# Patient Record
Sex: Female | Born: 1980 | Race: White | Hispanic: No | Marital: Single | Smoking: Current every day smoker
Health system: Southern US, Community
[De-identification: ages and names within clinical notes are randomized; demographics above are authoritative.]

## PROBLEM LIST (undated history)

## (undated) DIAGNOSIS — J45909 Unspecified asthma, uncomplicated: Secondary | ICD-10-CM

## (undated) DIAGNOSIS — J353 Hypertrophy of tonsils with hypertrophy of adenoids: Secondary | ICD-10-CM

## (undated) DIAGNOSIS — J449 Chronic obstructive pulmonary disease, unspecified: Secondary | ICD-10-CM

## (undated) DIAGNOSIS — K219 Gastro-esophageal reflux disease without esophagitis: Secondary | ICD-10-CM

## (undated) DIAGNOSIS — L409 Psoriasis, unspecified: Secondary | ICD-10-CM

## (undated) DIAGNOSIS — R203 Hyperesthesia: Secondary | ICD-10-CM

## (undated) HISTORY — PX: DILATION AND CURETTAGE OF UTERUS: SHX78

## (undated) HISTORY — PX: ABDOMINAL HYSTERECTOMY: SHX81

## (undated) HISTORY — PX: ELBOW SURGERY: SHX618

---
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
## 2003-08-09 ENCOUNTER — Encounter: Payer: Self-pay | Admitting: Emergency Medicine

## 2003-08-09 ENCOUNTER — Inpatient Hospital Stay (HOSPITAL_COMMUNITY): Admission: EM | Admit: 2003-08-09 | Discharge: 2003-08-18 | Payer: Self-pay

## 2003-08-10 ENCOUNTER — Encounter: Payer: Self-pay | Admitting: Neurosurgery

## 2004-07-30 ENCOUNTER — Emergency Department (HOSPITAL_COMMUNITY): Admission: EM | Admit: 2004-07-30 | Discharge: 2004-07-30 | Payer: Self-pay | Admitting: Emergency Medicine

## 2004-11-09 ENCOUNTER — Emergency Department (HOSPITAL_COMMUNITY): Admission: EM | Admit: 2004-11-09 | Discharge: 2004-11-09 | Payer: Self-pay | Admitting: Emergency Medicine

## 2004-12-29 ENCOUNTER — Emergency Department (HOSPITAL_COMMUNITY): Admission: EM | Admit: 2004-12-29 | Discharge: 2004-12-29 | Payer: Self-pay | Admitting: Emergency Medicine

## 2005-01-02 ENCOUNTER — Emergency Department (HOSPITAL_COMMUNITY): Admission: EM | Admit: 2005-01-02 | Discharge: 2005-01-02 | Payer: Self-pay | Admitting: Emergency Medicine

## 2005-02-10 ENCOUNTER — Emergency Department (HOSPITAL_COMMUNITY): Admission: EM | Admit: 2005-02-10 | Discharge: 2005-02-11 | Payer: Self-pay | Admitting: Emergency Medicine

## 2005-03-01 ENCOUNTER — Emergency Department (HOSPITAL_COMMUNITY): Admission: EM | Admit: 2005-03-01 | Discharge: 2005-03-01 | Payer: Self-pay | Admitting: Emergency Medicine

## 2005-07-12 ENCOUNTER — Emergency Department (HOSPITAL_COMMUNITY): Admission: EM | Admit: 2005-07-12 | Discharge: 2005-07-12 | Payer: Self-pay | Admitting: Emergency Medicine

## 2005-09-27 ENCOUNTER — Emergency Department (HOSPITAL_COMMUNITY): Admission: EM | Admit: 2005-09-27 | Discharge: 2005-09-27 | Payer: Self-pay | Admitting: Emergency Medicine

## 2005-12-25 ENCOUNTER — Emergency Department (HOSPITAL_COMMUNITY): Admission: EM | Admit: 2005-12-25 | Discharge: 2005-12-25 | Payer: Self-pay | Admitting: Emergency Medicine

## 2006-05-26 ENCOUNTER — Emergency Department (HOSPITAL_COMMUNITY): Admission: EM | Admit: 2006-05-26 | Discharge: 2006-05-26 | Payer: Self-pay | Admitting: Emergency Medicine

## 2006-06-18 ENCOUNTER — Emergency Department (HOSPITAL_COMMUNITY): Admission: EM | Admit: 2006-06-18 | Discharge: 2006-06-18 | Payer: Self-pay | Admitting: Emergency Medicine

## 2006-06-24 ENCOUNTER — Emergency Department (HOSPITAL_COMMUNITY): Admission: EM | Admit: 2006-06-24 | Discharge: 2006-06-24 | Payer: Self-pay | Admitting: Emergency Medicine

## 2006-07-07 ENCOUNTER — Ambulatory Visit (HOSPITAL_COMMUNITY): Admission: RE | Admit: 2006-07-07 | Discharge: 2006-07-07 | Payer: Self-pay | Admitting: Obstetrics and Gynecology

## 2006-07-11 ENCOUNTER — Ambulatory Visit (HOSPITAL_COMMUNITY): Admission: AD | Admit: 2006-07-11 | Discharge: 2006-07-11 | Payer: Self-pay | Admitting: Obstetrics and Gynecology

## 2006-10-14 ENCOUNTER — Emergency Department (HOSPITAL_COMMUNITY): Admission: EM | Admit: 2006-10-14 | Discharge: 2006-10-14 | Payer: Self-pay | Admitting: Emergency Medicine

## 2006-11-06 ENCOUNTER — Inpatient Hospital Stay (HOSPITAL_COMMUNITY): Admission: AD | Admit: 2006-11-06 | Discharge: 2006-11-08 | Payer: Self-pay | Admitting: Obstetrics and Gynecology

## 2006-11-06 ENCOUNTER — Ambulatory Visit (HOSPITAL_COMMUNITY): Admission: RE | Admit: 2006-11-06 | Discharge: 2006-11-06 | Payer: Self-pay | Admitting: Obstetrics and Gynecology

## 2007-02-05 ENCOUNTER — Emergency Department (HOSPITAL_COMMUNITY): Admission: EM | Admit: 2007-02-05 | Discharge: 2007-02-05 | Payer: Self-pay | Admitting: Emergency Medicine

## 2007-02-22 ENCOUNTER — Emergency Department (HOSPITAL_COMMUNITY): Admission: EM | Admit: 2007-02-22 | Discharge: 2007-02-22 | Payer: Self-pay | Admitting: Emergency Medicine

## 2007-04-20 ENCOUNTER — Emergency Department (HOSPITAL_COMMUNITY): Admission: EM | Admit: 2007-04-20 | Discharge: 2007-04-20 | Payer: Self-pay | Admitting: Emergency Medicine

## 2007-05-22 ENCOUNTER — Emergency Department (HOSPITAL_COMMUNITY): Admission: EM | Admit: 2007-05-22 | Discharge: 2007-05-22 | Payer: Self-pay | Admitting: Emergency Medicine

## 2007-06-07 ENCOUNTER — Emergency Department (HOSPITAL_COMMUNITY): Admission: EM | Admit: 2007-06-07 | Discharge: 2007-06-07 | Payer: Self-pay | Admitting: Emergency Medicine

## 2007-06-13 ENCOUNTER — Emergency Department (HOSPITAL_COMMUNITY): Admission: EM | Admit: 2007-06-13 | Discharge: 2007-06-13 | Payer: Self-pay | Admitting: Emergency Medicine

## 2007-06-27 LAB — CONVERTED CEMR LAB: Pap Smear: NORMAL

## 2007-07-05 ENCOUNTER — Emergency Department (HOSPITAL_COMMUNITY): Admission: EM | Admit: 2007-07-05 | Discharge: 2007-07-05 | Payer: Self-pay | Admitting: Emergency Medicine

## 2007-07-26 ENCOUNTER — Emergency Department (HOSPITAL_COMMUNITY): Admission: EM | Admit: 2007-07-26 | Discharge: 2007-07-26 | Payer: Self-pay | Admitting: Emergency Medicine

## 2007-08-24 ENCOUNTER — Emergency Department (HOSPITAL_COMMUNITY): Admission: EM | Admit: 2007-08-24 | Discharge: 2007-08-24 | Payer: Self-pay | Admitting: Emergency Medicine

## 2007-09-22 ENCOUNTER — Emergency Department (HOSPITAL_COMMUNITY): Admission: EM | Admit: 2007-09-22 | Discharge: 2007-09-22 | Payer: Self-pay | Admitting: Emergency Medicine

## 2007-10-02 ENCOUNTER — Emergency Department (HOSPITAL_COMMUNITY): Admission: EM | Admit: 2007-10-02 | Discharge: 2007-10-02 | Payer: Self-pay | Admitting: Emergency Medicine

## 2007-10-27 ENCOUNTER — Emergency Department (HOSPITAL_COMMUNITY): Admission: EM | Admit: 2007-10-27 | Discharge: 2007-10-27 | Payer: Self-pay | Admitting: Emergency Medicine

## 2007-12-09 ENCOUNTER — Emergency Department (HOSPITAL_COMMUNITY): Admission: EM | Admit: 2007-12-09 | Discharge: 2007-12-09 | Payer: Self-pay | Admitting: Emergency Medicine

## 2007-12-20 ENCOUNTER — Emergency Department (HOSPITAL_COMMUNITY): Admission: EM | Admit: 2007-12-20 | Discharge: 2007-12-20 | Payer: Self-pay | Admitting: Emergency Medicine

## 2007-12-26 ENCOUNTER — Encounter (INDEPENDENT_AMBULATORY_CARE_PROVIDER_SITE_OTHER): Payer: Self-pay | Admitting: Family Medicine

## 2008-01-07 ENCOUNTER — Ambulatory Visit: Payer: Self-pay | Admitting: Family Medicine

## 2008-01-07 ENCOUNTER — Telehealth (INDEPENDENT_AMBULATORY_CARE_PROVIDER_SITE_OTHER): Payer: Self-pay | Admitting: Family Medicine

## 2008-01-07 DIAGNOSIS — R5381 Other malaise: Secondary | ICD-10-CM | POA: Insufficient documentation

## 2008-01-07 DIAGNOSIS — L719 Rosacea, unspecified: Secondary | ICD-10-CM

## 2008-01-07 DIAGNOSIS — R5383 Other fatigue: Secondary | ICD-10-CM

## 2008-01-08 ENCOUNTER — Emergency Department (HOSPITAL_COMMUNITY): Admission: EM | Admit: 2008-01-08 | Discharge: 2008-01-08 | Payer: Self-pay | Admitting: Emergency Medicine

## 2008-01-09 ENCOUNTER — Encounter (INDEPENDENT_AMBULATORY_CARE_PROVIDER_SITE_OTHER): Payer: Self-pay | Admitting: Family Medicine

## 2008-01-09 ENCOUNTER — Telehealth (INDEPENDENT_AMBULATORY_CARE_PROVIDER_SITE_OTHER): Payer: Self-pay | Admitting: *Deleted

## 2008-01-09 LAB — CONVERTED CEMR LAB
ALT: 18 units/L (ref 0–35)
AST: 14 units/L (ref 0–37)
Albumin: 4.8 g/dL (ref 3.5–5.2)
Alkaline Phosphatase: 81 units/L (ref 39–117)
BUN: 12 mg/dL (ref 6–23)
Basophils Absolute: 0 10*3/uL (ref 0.0–0.1)
Basophils Relative: 0 % (ref 0–1)
CO2: 21 meq/L (ref 19–32)
Calcium: 9.5 mg/dL (ref 8.4–10.5)
Chloride: 107 meq/L (ref 96–112)
Creatinine, Ser: 0.82 mg/dL (ref 0.40–1.20)
Eosinophils Absolute: 0.2 10*3/uL (ref 0.0–0.7)
Eosinophils Relative: 3 % (ref 0–5)
Glucose, Bld: 84 mg/dL (ref 70–99)
HCT: 46.2 % — ABNORMAL HIGH (ref 36.0–46.0)
Hemoglobin: 15.8 g/dL — ABNORMAL HIGH (ref 12.0–15.0)
Lymphocytes Relative: 30 % (ref 12–46)
Lymphs Abs: 1.8 10*3/uL (ref 0.7–4.0)
MCHC: 34.2 g/dL (ref 30.0–36.0)
MCV: 88.5 fL (ref 78.0–100.0)
Monocytes Absolute: 0.3 10*3/uL (ref 0.1–1.0)
Monocytes Relative: 6 % (ref 3–12)
Neutro Abs: 3.7 10*3/uL (ref 1.7–7.7)
Neutrophils Relative %: 62 % (ref 43–77)
Platelets: 212 10*3/uL (ref 150–400)
Potassium: 4.3 meq/L (ref 3.5–5.3)
RBC: 5.22 M/uL — ABNORMAL HIGH (ref 3.87–5.11)
RDW: 13.1 % (ref 11.5–15.5)
Sodium: 140 meq/L (ref 135–145)
TSH: 0.892 microintl units/mL (ref 0.350–5.50)
Total Bilirubin: 0.5 mg/dL (ref 0.3–1.2)
Total Protein: 7.2 g/dL (ref 6.0–8.3)
WBC: 6 10*3/uL (ref 4.0–10.5)

## 2008-01-13 ENCOUNTER — Encounter (INDEPENDENT_AMBULATORY_CARE_PROVIDER_SITE_OTHER): Payer: Self-pay | Admitting: Family Medicine

## 2008-01-21 ENCOUNTER — Ambulatory Visit: Payer: Self-pay | Admitting: Family Medicine

## 2008-01-21 DIAGNOSIS — F411 Generalized anxiety disorder: Secondary | ICD-10-CM | POA: Insufficient documentation

## 2008-01-21 DIAGNOSIS — F17200 Nicotine dependence, unspecified, uncomplicated: Secondary | ICD-10-CM | POA: Insufficient documentation

## 2008-01-21 DIAGNOSIS — D751 Secondary polycythemia: Secondary | ICD-10-CM

## 2008-02-04 ENCOUNTER — Ambulatory Visit: Payer: Self-pay | Admitting: Family Medicine

## 2008-02-13 ENCOUNTER — Encounter (INDEPENDENT_AMBULATORY_CARE_PROVIDER_SITE_OTHER): Payer: Self-pay | Admitting: Family Medicine

## 2008-02-13 ENCOUNTER — Telehealth (INDEPENDENT_AMBULATORY_CARE_PROVIDER_SITE_OTHER): Payer: Self-pay | Admitting: Family Medicine

## 2008-02-13 ENCOUNTER — Emergency Department (HOSPITAL_COMMUNITY): Admission: EM | Admit: 2008-02-13 | Discharge: 2008-02-13 | Payer: Self-pay | Admitting: Emergency Medicine

## 2008-02-13 ENCOUNTER — Telehealth (INDEPENDENT_AMBULATORY_CARE_PROVIDER_SITE_OTHER): Payer: Self-pay | Admitting: *Deleted

## 2008-02-15 ENCOUNTER — Ambulatory Visit: Payer: Self-pay | Admitting: Family Medicine

## 2008-02-15 DIAGNOSIS — R51 Headache: Secondary | ICD-10-CM

## 2008-02-15 DIAGNOSIS — R519 Headache, unspecified: Secondary | ICD-10-CM | POA: Insufficient documentation

## 2008-02-16 ENCOUNTER — Emergency Department (HOSPITAL_COMMUNITY): Admission: EM | Admit: 2008-02-16 | Discharge: 2008-02-16 | Payer: Self-pay | Admitting: Emergency Medicine

## 2008-02-18 ENCOUNTER — Ambulatory Visit: Payer: Self-pay | Admitting: Family Medicine

## 2008-03-28 ENCOUNTER — Ambulatory Visit: Payer: Self-pay | Admitting: Family Medicine

## 2008-03-28 LAB — CONVERTED CEMR LAB: Beta hcg, urine, semiquantitative: NEGATIVE

## 2008-04-16 ENCOUNTER — Ambulatory Visit: Payer: Self-pay | Admitting: Family Medicine

## 2008-04-25 ENCOUNTER — Ambulatory Visit: Payer: Self-pay | Admitting: Family Medicine

## 2008-05-09 ENCOUNTER — Ambulatory Visit: Payer: Self-pay | Admitting: Family Medicine

## 2008-05-09 ENCOUNTER — Ambulatory Visit (HOSPITAL_COMMUNITY): Admission: RE | Admit: 2008-05-09 | Discharge: 2008-05-09 | Payer: Self-pay | Admitting: Family Medicine

## 2008-06-03 ENCOUNTER — Ambulatory Visit: Payer: Self-pay | Admitting: Family Medicine

## 2008-06-03 LAB — CONVERTED CEMR LAB: Beta hcg, urine, semiquantitative: NEGATIVE

## 2008-06-17 ENCOUNTER — Emergency Department (HOSPITAL_COMMUNITY): Admission: EM | Admit: 2008-06-17 | Discharge: 2008-06-17 | Payer: Self-pay | Admitting: Emergency Medicine

## 2008-06-20 ENCOUNTER — Ambulatory Visit: Payer: Self-pay | Admitting: Family Medicine

## 2008-08-02 ENCOUNTER — Emergency Department (HOSPITAL_COMMUNITY): Admission: EM | Admit: 2008-08-02 | Discharge: 2008-08-02 | Payer: Self-pay | Admitting: Emergency Medicine

## 2008-08-06 ENCOUNTER — Emergency Department (HOSPITAL_COMMUNITY): Admission: EM | Admit: 2008-08-06 | Discharge: 2008-08-06 | Payer: Self-pay | Admitting: Emergency Medicine

## 2008-08-15 ENCOUNTER — Emergency Department (HOSPITAL_COMMUNITY): Admission: EM | Admit: 2008-08-15 | Discharge: 2008-08-15 | Payer: Self-pay | Admitting: Emergency Medicine

## 2008-08-19 ENCOUNTER — Emergency Department (HOSPITAL_COMMUNITY): Admission: EM | Admit: 2008-08-19 | Discharge: 2008-08-19 | Payer: Self-pay | Admitting: Emergency Medicine

## 2008-08-19 ENCOUNTER — Encounter (INDEPENDENT_AMBULATORY_CARE_PROVIDER_SITE_OTHER): Payer: Self-pay | Admitting: Family Medicine

## 2008-08-21 ENCOUNTER — Ambulatory Visit: Payer: Self-pay | Admitting: Family Medicine

## 2008-08-21 DIAGNOSIS — J209 Acute bronchitis, unspecified: Secondary | ICD-10-CM

## 2008-08-21 DIAGNOSIS — B86 Scabies: Secondary | ICD-10-CM | POA: Insufficient documentation

## 2008-08-21 DIAGNOSIS — T7491XA Unspecified adult maltreatment, confirmed, initial encounter: Secondary | ICD-10-CM | POA: Insufficient documentation

## 2008-08-25 ENCOUNTER — Emergency Department (HOSPITAL_COMMUNITY): Admission: EM | Admit: 2008-08-25 | Discharge: 2008-08-26 | Payer: Self-pay | Admitting: Emergency Medicine

## 2008-08-26 ENCOUNTER — Encounter (INDEPENDENT_AMBULATORY_CARE_PROVIDER_SITE_OTHER): Payer: Self-pay | Admitting: Family Medicine

## 2008-09-09 ENCOUNTER — Other Ambulatory Visit: Admission: RE | Admit: 2008-09-09 | Discharge: 2008-09-09 | Payer: Self-pay | Admitting: Obstetrics and Gynecology

## 2008-09-10 ENCOUNTER — Emergency Department (HOSPITAL_COMMUNITY): Admission: EM | Admit: 2008-09-10 | Discharge: 2008-09-10 | Payer: Self-pay | Admitting: Professional

## 2008-09-11 LAB — CONVERTED CEMR LAB: Pap Smear: NORMAL

## 2008-09-17 ENCOUNTER — Encounter (INDEPENDENT_AMBULATORY_CARE_PROVIDER_SITE_OTHER): Payer: Self-pay | Admitting: Family Medicine

## 2008-10-06 ENCOUNTER — Encounter (INDEPENDENT_AMBULATORY_CARE_PROVIDER_SITE_OTHER): Payer: Self-pay | Admitting: Family Medicine

## 2008-10-09 ENCOUNTER — Ambulatory Visit: Payer: Self-pay | Admitting: Family Medicine

## 2008-10-09 DIAGNOSIS — L259 Unspecified contact dermatitis, unspecified cause: Secondary | ICD-10-CM | POA: Insufficient documentation

## 2008-10-30 ENCOUNTER — Emergency Department (HOSPITAL_COMMUNITY): Admission: EM | Admit: 2008-10-30 | Discharge: 2008-10-30 | Payer: Self-pay | Admitting: Emergency Medicine

## 2008-11-03 ENCOUNTER — Encounter (INDEPENDENT_AMBULATORY_CARE_PROVIDER_SITE_OTHER): Payer: Self-pay | Admitting: Family Medicine

## 2008-11-24 ENCOUNTER — Emergency Department (HOSPITAL_COMMUNITY): Admission: EM | Admit: 2008-11-24 | Discharge: 2008-11-24 | Payer: Self-pay | Admitting: Emergency Medicine

## 2009-01-15 ENCOUNTER — Encounter (INDEPENDENT_AMBULATORY_CARE_PROVIDER_SITE_OTHER): Payer: Self-pay | Admitting: Family Medicine

## 2009-02-13 ENCOUNTER — Emergency Department (HOSPITAL_COMMUNITY): Admission: EM | Admit: 2009-02-13 | Discharge: 2009-02-13 | Payer: Self-pay | Admitting: Emergency Medicine

## 2009-03-06 ENCOUNTER — Emergency Department (HOSPITAL_COMMUNITY): Admission: EM | Admit: 2009-03-06 | Discharge: 2009-03-06 | Payer: Self-pay | Admitting: Emergency Medicine

## 2009-03-11 ENCOUNTER — Emergency Department (HOSPITAL_COMMUNITY): Admission: EM | Admit: 2009-03-11 | Discharge: 2009-03-11 | Payer: Self-pay | Admitting: Emergency Medicine
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
## 2011-04-29 NOTE — H&P (Signed)
NAMELAVIDA, PATCH NO.:  [id number]   MEDICAL RECORD NO.:  [id number]          PATIENT TYPE:  INP   LOCATION:  LDR2                          FACILITY:  [facility]   PHYSICIAN:  Tilda Burrow, M.D. DATE OF BIRTH:  June 24, 1981   DATE OF ADMISSION:  11/06/2006  DATE OF DISCHARGE:  LH                              HISTORY & PHYSICAL   REASON FOR ADMISSION:  Pregnancy at 38-1/2 weeks, in early labor.   HISTORY OF PRESENT ILLNESS:  Tionna is a 30 year old gravida 1, para  0, who is in early labor.   HISTORY:  The patient has issues with cocaine use, also has been  homeless numerous times during the course of this pregnancy.  Supposedly, her sister is her power of attorney, but this patient has  had limited care in regards to a place to live, nutrition.  She has had  regular visits during the course of the pregnancy.   MEDICAL HISTORY:  Positive for she is developmentally delayed.  She has  rosacea.  She has had problems with incarceration, drug use.   SURGICAL HISTORY:  Negative.   FAMILY HISTORY:  Positive for breast cancer and coronary artery disease.   PRENATAL COURSE:  Has been complicated by homelessness, developmentally  delay, rosacea outbreak, drug use.  Blood type is B positive.  UDS is  positive at the hospital several times for cocaine.  Rubella is  equivocal.  Hepatitis B surface antigen negative.  HIV is negative.  HSV  is negative.  Serologies non-reactive, Pap normal, GC is negative.  Repeats are negative, GBS is negative, AFP normal.   PHYSICAL EXAMINATION:  VITAL SIGNS:  Stable.  Cervix  4 cm.  Cervix is  completely effaced.  Vertex presentation at -1 station.  AROM was  performed.  Fetal heart rate pattern is stable.  Patient is very verbal  and somewhat uncooperative, does not desire an epidural, because she is  afraid of needles but desires IV pain management for her labor.      Zerita Boers, Lanier Clam      Tilda Burrow, M.D.  Electronically Signed    DL/MEDQ  D:  16/09/9603  T:  11/06/2006  Job:  [id number]   cc:   Francoise Schaumann. Raynelle Highland  Fax: [phone]   [facility]
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
## 2011-04-29 NOTE — H&P (Signed)
[name], [name] NO.:  [id number]   MEDICAL RECORD NO.:  [id number]          PATIENT TYPE:  OIB   LOCATION:  A415                          FACILITY:  [facility]   PHYSICIAN:  Tilda Burrow, M.D. DATE OF BIRTH:  1980-12-25   DATE OF ADMISSION:  11/06/2006  DATE OF DISCHARGE:  11/26/2007LH                              HISTORY & PHYSICAL   Angela Fisher is in complaining of irregular contractions since 1 a.m.  On  admission, cervix is 1, completely effaced, -1 station.  Two hours after  that, with ambulation, cervix is unchanged.  She is still having mild  irregular contractions.  I discussed plan of care with patient.  She is  in agreement with me.  She does not want to be induced at the present  time.  She would like something for discomfort, go home and take a nap  and then if she is in real labor when her contractions get regular, 3-5  minutes apart, she will come back.  Fetal heart rate pattern is reactive  with accelerations.  Contractions are irregular, 10-15 minutes apart.   PLAN:  We are going to discharge her home.  Therapeutic rest.      Zerita Boers, N.M.      Tilda Burrow, M.D.  Electronically Signed    DL/MEDQ  D:  16/09/9603  T:  11/06/2006  Job:  [id number]   cc:   [facility]

## 2011-04-29 NOTE — Group Therapy Note (Signed)
Angela Fisher, Angela Fisher NO.:  [id number]   MEDICAL RECORD NO.:  [id number]          PATIENT TYPE:  INP   LOCATION:  LDR2                          FACILITY:  [facility]   PHYSICIAN:  Tilda Burrow, M.D. DATE OF BIRTH:  02/15/1981   DATE OF PROCEDURE:  DATE OF DISCHARGE:                                 PROGRESS NOTE   Onset of labor is November 06, 2006 at 8 p.m.  Date of delivery November 07, 2006 at 12:07 a.m.  Length of first stage of labor is 3 hours and 51  minutes.  Length of second stage of labor 60 minutes.  Length of third  stage of labor 8 minutes.   DELIVERY NOTE:  After a prolonged 3 minute deceleration, Angela Fisher has a  spontaneous vaginal delivery of a viable female infant  by Dr Emelda Fear.  Upon delivery of head, there is a nuchal cord noted x2 which was easily  reduced by Dr. Emelda Fear and spontaneous delivery of the infant upon  delivery.  Infant had a strong cry, pinked up well, good color and tone.  Infant was thoroughly suctioned and dried.  Cord clamped and cut and  taken to the newborn nursery nurse for care.  Upon inspection, there is  a true knot also noted in the cord.  Cord blood and cord blood gas was  obtained and sent to the respiratory therapy.  Third stage of labor was  actively managed with 20 units of Pitocin and a 1000 mL of d5 LR at a  rapid rate.  Placenta was delivered spontaneously via _crede uterine  massage___.  Three vessel cord is noted upon inspection.  __________ are  noted to be intact upon inspection.  There is a second degree perineal  laceration and a right labial laceration which was infiltrated with  approximately 15 mL of 1% lidocaine and repaired with 2-0 Vicryl.  Good  hemostasis was obtained.  Infant and mother both stabilized and  transferred out to the post recovery unit.  Estimated blood loss was  approximately 350 mL.      Angela Fisher, Angela Fisher      Tilda Burrow, M.D.  Electronically Signed    DL/MEDQ   D:  71/05/2693  T:  11/07/2006  Job:  [id number]   cc:   Francoise Schaumann. Raynelle Highland  Fax: [phone]   Tilda Burrow, M.D.  Fax: [phone]
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
## 2011-09-05 LAB — DIFFERENTIAL
Basophils Absolute: 0
Basophils Relative: 0
Eosinophils Absolute: 0.1
Eosinophils Relative: 1
Lymphocytes Relative: 5 — ABNORMAL LOW
Lymphs Abs: 0.5 — ABNORMAL LOW
Monocytes Absolute: 0.2
Monocytes Relative: 2 — ABNORMAL LOW
Neutro Abs: 9.5 — ABNORMAL HIGH
Neutrophils Relative %: 92 — ABNORMAL HIGH

## 2011-09-05 LAB — URINE MICROSCOPIC-ADD ON

## 2011-09-05 LAB — URINALYSIS, ROUTINE W REFLEX MICROSCOPIC
Glucose, UA: NEGATIVE
Ketones, ur: 15 — AB
Leukocytes, UA: NEGATIVE
Nitrite: NEGATIVE
Protein, ur: NEGATIVE
Specific Gravity, Urine: >1.03 — ABNORMAL HIGH
Urobilinogen, UA: 0.2
pH: 5.5

## 2011-09-05 LAB — BASIC METABOLIC PANEL
BUN: 12
CO2: 23
Calcium: 9.2
Chloride: 102
Creatinine, Ser: 0.7
GFR calc Af Amer: >60
GFR calc non Af Amer: >60
Glucose, Bld: 94
Potassium: 3.7
Sodium: 135

## 2011-09-05 LAB — CBC
HCT: 47.3 — ABNORMAL HIGH
Hemoglobin: 16.3 — ABNORMAL HIGH
MCHC: 34.4
MCV: 89.2
Platelets: 162
RBC: 5.3 — ABNORMAL HIGH
RDW: 13.2
WBC: 10.4

## 2011-09-05 LAB — PREGNANCY, URINE: Preg Test, Ur: NEGATIVE

## 2011-09-08 LAB — URINALYSIS, ROUTINE W REFLEX MICROSCOPIC
Bilirubin Urine: NEGATIVE
Glucose, UA: NEGATIVE
Hgb urine dipstick: NEGATIVE
Ketones, ur: NEGATIVE
Nitrite: NEGATIVE
Protein, ur: NEGATIVE
Specific Gravity, Urine: 1.015
Urobilinogen, UA: 0.2
pH: 6

## 2011-09-08 LAB — PREGNANCY, URINE: Preg Test, Ur: POSITIVE

## 2011-09-13 LAB — BASIC METABOLIC PANEL
CO2: 25
Calcium: 9.1
GFR calc Af Amer: >60
GFR calc non Af Amer: >60
Sodium: 140

## 2011-09-13 LAB — URINALYSIS, ROUTINE W REFLEX MICROSCOPIC
Bilirubin Urine: NEGATIVE
Glucose, UA: NEGATIVE
Ketones, ur: NEGATIVE
Leukocytes, UA: NEGATIVE
Nitrite: NEGATIVE
Protein, ur: NEGATIVE
Specific Gravity, Urine: 1.025
Urobilinogen, UA: 0.2
pH: 5.5

## 2011-09-13 LAB — URINE MICROSCOPIC-ADD ON

## 2011-09-13 LAB — BASIC METABOLIC PANEL WITH GFR
BUN: 16
Chloride: 106
Creatinine, Ser: 0.87
Glucose, Bld: 88
Potassium: 4.1

## 2011-09-13 LAB — PREGNANCY, URINE: Preg Test, Ur: NEGATIVE

## 2011-09-16 LAB — CBC
HCT: 45.8 % (ref 36.0–46.0)
Hemoglobin: 15.7 g/dL — ABNORMAL HIGH (ref 12.0–15.0)
Platelets: 170 10*3/uL (ref 150–400)
RDW: 12.8 % (ref 11.5–15.5)
WBC: 7.3 10*3/uL (ref 4.0–10.5)

## 2011-09-16 LAB — BASIC METABOLIC PANEL
BUN: 12 mg/dL (ref 6–23)
Calcium: 9.1 mg/dL (ref 8.4–10.5)
GFR calc non Af Amer: >60 mL/min (ref >60)
Glucose, Bld: 94 mg/dL (ref 70–99)
Potassium: 3.5 mEq/L (ref 3.5–5.1)
Sodium: 140 mEq/L (ref 135–145)

## 2011-09-16 LAB — DIFFERENTIAL
Basophils Absolute: 0 10*3/uL (ref 0.0–0.1)
Eosinophils Relative: 1 % (ref 0–5)
Lymphocytes Relative: 28 % (ref 12–46)
Lymphs Abs: 2 10*3/uL (ref 0.7–4.0)
Neutro Abs: 4.8 10*3/uL (ref 1.7–7.7)

## 2011-09-16 LAB — URINE MICROSCOPIC-ADD ON

## 2011-09-16 LAB — URINALYSIS, ROUTINE W REFLEX MICROSCOPIC
Bilirubin Urine: NEGATIVE
Specific Gravity, Urine: 1.025 (ref 1.005–1.030)
pH: 5 (ref 5.0–8.0)

## 2011-09-20 LAB — GC/CHLAMYDIA PROBE AMP, GENITAL: GC Probe Amp, Genital: NEGATIVE

## 2011-09-20 LAB — WET PREP, GENITAL: Yeast Wet Prep HPF POC: NONE SEEN

## 2011-09-26 LAB — DIFFERENTIAL
Basophils Relative: 0
Eosinophils Absolute: 0.1
Eosinophils Relative: 1
Lymphs Abs: 1.9
Monocytes Relative: 6

## 2011-09-26 LAB — COMPREHENSIVE METABOLIC PANEL
ALT: 19
AST: 20
Alkaline Phosphatase: 73
CO2: 24
Calcium: 8.8
GFR calc Af Amer: >60
GFR calc non Af Amer: >60
Potassium: 3.6
Sodium: 140
Total Protein: 6.7

## 2011-09-26 LAB — URINE MICROSCOPIC-ADD ON

## 2011-09-26 LAB — URINALYSIS, ROUTINE W REFLEX MICROSCOPIC
Bilirubin Urine: NEGATIVE
Glucose, UA: NEGATIVE
Ketones, ur: NEGATIVE
Leukocytes, UA: NEGATIVE
Nitrite: NEGATIVE
Protein, ur: NEGATIVE
Specific Gravity, Urine: >1.03 — ABNORMAL HIGH
Urobilinogen, UA: 0.2
pH: 5.5
pH: 5.5

## 2011-09-26 LAB — CBC
Hemoglobin: 14.4
MCHC: 33.9
RBC: 4.9

## 2011-09-26 LAB — PREGNANCY, URINE: Preg Test, Ur: NEGATIVE

## 2011-09-27 LAB — STREP A DNA PROBE: Group A Strep Probe: NEGATIVE

## 2011-09-28 LAB — COMPREHENSIVE METABOLIC PANEL
ALT: 17
Alkaline Phosphatase: 71
CO2: 27
Chloride: 104
GFR calc non Af Amer: >60
Glucose, Bld: 97
Potassium: 3.5
Sodium: 139
Total Bilirubin: 1

## 2011-09-28 LAB — CBC
Hemoglobin: 15.1 — ABNORMAL HIGH
RBC: 5.09

## 2011-09-28 LAB — DIFFERENTIAL
Basophils Relative: 0
Eosinophils Absolute: 0.1
Monocytes Relative: 8
Neutrophils Relative %: 70

## 2011-09-29 LAB — URINALYSIS, ROUTINE W REFLEX MICROSCOPIC
Hgb urine dipstick: NEGATIVE
Nitrite: NEGATIVE
Specific Gravity, Urine: 1.025
Urobilinogen, UA: 0.2

## 2011-09-29 LAB — COMPREHENSIVE METABOLIC PANEL
ALT: 25
AST: 18
Albumin: 4
CO2: 22
Chloride: 111
GFR calc Af Amer: >60
GFR calc non Af Amer: >60
Potassium: 3.7
Sodium: 139
Total Bilirubin: 0.6

## 2011-09-29 LAB — DIFFERENTIAL
Basophils Absolute: 0
Eosinophils Absolute: 0.1
Eosinophils Relative: 2
Lymphs Abs: 2.5
Monocytes Absolute: 0.4

## 2011-09-29 LAB — CBC
RBC: 4.67
WBC: 7.7

## 2011-09-29 LAB — PREGNANCY, URINE: Preg Test, Ur: NEGATIVE

## 2016-09-11 DIAGNOSIS — J353 Hypertrophy of tonsils with hypertrophy of adenoids: Secondary | ICD-10-CM

## 2016-09-11 HISTORY — DX: Hypertrophy of tonsils with hypertrophy of adenoids: J35.3

## 2016-09-22 ENCOUNTER — Ambulatory Visit (INDEPENDENT_AMBULATORY_CARE_PROVIDER_SITE_OTHER): Payer: Medicaid Other | Admitting: Otolaryngology

## 2016-09-22 DIAGNOSIS — J353 Hypertrophy of tonsils with hypertrophy of adenoids: Secondary | ICD-10-CM | POA: Diagnosis not present

## 2016-09-22 DIAGNOSIS — J3501 Chronic tonsillitis: Secondary | ICD-10-CM

## 2016-09-28 ENCOUNTER — Other Ambulatory Visit: Payer: Self-pay | Admitting: Otolaryngology

## 2016-10-06 ENCOUNTER — Encounter (HOSPITAL_BASED_OUTPATIENT_CLINIC_OR_DEPARTMENT_OTHER): Payer: Self-pay | Admitting: *Deleted

## 2016-10-11 ENCOUNTER — Encounter (HOSPITAL_BASED_OUTPATIENT_CLINIC_OR_DEPARTMENT_OTHER)
Admission: RE | Disposition: A | Discharge status: Home / Self Care | Payer: Self-pay | Source: Ambulatory Visit | Attending: Otolaryngology

## 2016-10-11 ENCOUNTER — Ambulatory Visit (HOSPITAL_BASED_OUTPATIENT_CLINIC_OR_DEPARTMENT_OTHER)
Admission: RE | Admit: 2016-10-11 | Discharge: 2016-10-11 | Disposition: A | Discharge status: Home / Self Care | Payer: Medicaid Other | Source: Ambulatory Visit | Attending: Otolaryngology | Admitting: Otolaryngology

## 2016-10-11 ENCOUNTER — Ambulatory Visit (HOSPITAL_BASED_OUTPATIENT_CLINIC_OR_DEPARTMENT_OTHER): Payer: Medicaid Other | Admitting: Anesthesiology

## 2016-10-11 ENCOUNTER — Encounter (HOSPITAL_BASED_OUTPATIENT_CLINIC_OR_DEPARTMENT_OTHER): Payer: Self-pay | Admitting: *Deleted

## 2016-10-11 DIAGNOSIS — J3501 Chronic tonsillitis: Secondary | ICD-10-CM | POA: Insufficient documentation

## 2016-10-11 DIAGNOSIS — K219 Gastro-esophageal reflux disease without esophagitis: Secondary | ICD-10-CM | POA: Insufficient documentation

## 2016-10-11 DIAGNOSIS — Z6833 Body mass index (BMI) 33.0-33.9, adult: Secondary | ICD-10-CM | POA: Diagnosis not present

## 2016-10-11 DIAGNOSIS — Z87891 Personal history of nicotine dependence: Secondary | ICD-10-CM | POA: Diagnosis not present

## 2016-10-11 DIAGNOSIS — J3503 Chronic tonsillitis and adenoiditis: Secondary | ICD-10-CM | POA: Diagnosis not present

## 2016-10-11 DIAGNOSIS — Z79899 Other long term (current) drug therapy: Secondary | ICD-10-CM | POA: Diagnosis not present

## 2016-10-11 DIAGNOSIS — J353 Hypertrophy of tonsils with hypertrophy of adenoids: Secondary | ICD-10-CM | POA: Insufficient documentation

## 2016-10-11 HISTORY — DX: Psoriasis, unspecified: L40.9

## 2016-10-11 HISTORY — DX: Hyperesthesia: R20.3

## 2016-10-11 HISTORY — PX: TONSILLECTOMY AND ADENOIDECTOMY: SHX28

## 2016-10-11 HISTORY — DX: Gastro-esophageal reflux disease without esophagitis: K21.9

## 2016-10-11 HISTORY — DX: Hypertrophy of tonsils with hypertrophy of adenoids: J35.3

## 2016-10-11 SURGERY — TONSILLECTOMY AND ADENOIDECTOMY
Anesthesia: General | Site: Throat | Laterality: Bilateral

## 2016-10-11 MED ORDER — OXYMETAZOLINE HCL 0.05 % NA SOLN
NASAL | Status: DC | PRN
  Administered 2016-10-11: 1 via TOPICAL

## 2016-10-11 MED ORDER — ONDANSETRON HCL 4 MG/2ML IJ SOLN
INTRAMUSCULAR | Status: DC | PRN
Start: 2016-10-11 — End: 2016-10-11
  Administered 2016-10-11: 4 mg via INTRAVENOUS

## 2016-10-11 MED ORDER — SCOPOLAMINE 1 MG/3DAYS TD PT72: 1.0000 | MEDICATED_PATCH | Freq: Once | TRANSDERMAL | Status: DC | PRN

## 2016-10-11 MED ORDER — HYDROMORPHONE HCL 1 MG/ML IJ SOLN
0.2500 mg | INTRAMUSCULAR | Status: DC | PRN
  Administered 2016-10-11 (×2): 0.5 mg via INTRAVENOUS

## 2016-10-11 MED ORDER — LACTATED RINGERS IV SOLN
INTRAVENOUS | Status: DC
  Administered 2016-10-11 (×2): via INTRAVENOUS

## 2016-10-11 MED ORDER — FENTANYL CITRATE (PF) 100 MCG/2ML IJ SOLN
50.0000 ug | INTRAMUSCULAR | Status: DC | PRN
  Administered 2016-10-11: 100 ug via INTRAVENOUS

## 2016-10-11 MED ORDER — FENTANYL CITRATE (PF) 100 MCG/2ML IJ SOLN
INTRAMUSCULAR | Status: AC
  Filled 2016-10-11: qty 2

## 2016-10-11 MED ORDER — SUCCINYLCHOLINE CHLORIDE 200 MG/10ML IV SOSY
PREFILLED_SYRINGE | INTRAVENOUS | Status: AC
  Filled 2016-10-11: qty 10

## 2016-10-11 MED ORDER — AMOXICILLIN 400 MG/5ML PO SUSR: 800.0000 mg | Freq: Two times a day (BID) | ORAL | 0 refills | Status: AC

## 2016-10-11 MED ORDER — OXYCODONE HCL 5 MG/5ML PO SOLN: 5.0000 mg | ORAL | 0 refills | Status: DC | PRN

## 2016-10-11 MED ORDER — GLYCOPYRROLATE 0.2 MG/ML IJ SOLN: 0.2000 mg | Freq: Once | INTRAMUSCULAR | Status: DC | PRN

## 2016-10-11 MED ORDER — LIDOCAINE HCL (CARDIAC) 20 MG/ML IV SOLN
INTRAVENOUS | Status: DC | PRN
  Administered 2016-10-11: 100 mg via INTRAVENOUS

## 2016-10-11 MED ORDER — ONDANSETRON HCL 4 MG/2ML IJ SOLN: 4.0000 mg | Freq: Once | INTRAMUSCULAR | Status: DC | PRN

## 2016-10-11 MED ORDER — DEXAMETHASONE SODIUM PHOSPHATE 10 MG/ML IJ SOLN
INTRAMUSCULAR | Status: AC
  Filled 2016-10-11: qty 1

## 2016-10-11 MED ORDER — OXYCODONE HCL 5 MG PO TABS: 5.0000 mg | ORAL_TABLET | Freq: Once | ORAL | Status: DC | PRN

## 2016-10-11 MED ORDER — LIDOCAINE 2% (20 MG/ML) 5 ML SYRINGE
INTRAMUSCULAR | Status: AC
  Filled 2016-10-11: qty 5

## 2016-10-11 MED ORDER — SUCCINYLCHOLINE CHLORIDE 20 MG/ML IJ SOLN
INTRAMUSCULAR | Status: DC | PRN
  Administered 2016-10-11: 100 mg via INTRAVENOUS

## 2016-10-11 MED ORDER — MIDAZOLAM HCL 2 MG/2ML IJ SOLN
1.0000 mg | INTRAMUSCULAR | Status: DC | PRN
  Administered 2016-10-11: 2 mg via INTRAVENOUS

## 2016-10-11 MED ORDER — PROPOFOL 10 MG/ML IV BOLUS
INTRAVENOUS | Status: DC | PRN
  Administered 2016-10-11: 200 mg via INTRAVENOUS
  Administered 2016-10-11: 50 mg via INTRAVENOUS

## 2016-10-11 MED ORDER — DEXAMETHASONE SODIUM PHOSPHATE 4 MG/ML IJ SOLN
INTRAMUSCULAR | Status: DC | PRN
  Administered 2016-10-11: 10 mg via INTRAVENOUS

## 2016-10-11 MED ORDER — HYDROMORPHONE HCL 1 MG/ML IJ SOLN
INTRAMUSCULAR | Status: AC
  Filled 2016-10-11: qty 1

## 2016-10-11 MED ORDER — MEPERIDINE HCL 25 MG/ML IJ SOLN: 6.2500 mg | INTRAMUSCULAR | Status: DC | PRN

## 2016-10-11 MED ORDER — SODIUM CHLORIDE 0.9 % IR SOLN
Status: DC | PRN
  Administered 2016-10-11: 500 mL

## 2016-10-11 MED ORDER — ONDANSETRON HCL 4 MG/2ML IJ SOLN
INTRAMUSCULAR | Status: AC
  Filled 2016-10-11: qty 2

## 2016-10-11 MED ORDER — MIDAZOLAM HCL 2 MG/2ML IJ SOLN
INTRAMUSCULAR | Status: AC
  Filled 2016-10-11: qty 2

## 2016-10-11 MED ORDER — OXYCODONE HCL 5 MG/5ML PO SOLN: 5.0000 mg | Freq: Once | ORAL | Status: DC | PRN

## 2016-10-11 SURGICAL SUPPLY — 31 items
BANDAGE COBAN STERILE 2 (GAUZE/BANDAGES/DRESSINGS) IMPLANT
CANISTER SUCT 1200ML W/VALVE (MISCELLANEOUS) ×3 IMPLANT
CATH ROBINSON RED A/P 10FR (CATHETERS) IMPLANT
CATH ROBINSON RED A/P 14FR (CATHETERS) ×3 IMPLANT
COAGULATOR SUCT 6 FR SWTCH (ELECTROSURGICAL)
COAGULATOR SUCT SWTCH 10FR 6 (ELECTROSURGICAL) IMPLANT
COVER MAYO STAND STRL (DRAPES) ×3 IMPLANT
ELECT REM PT RETURN 9FT ADLT (ELECTROSURGICAL) ×3
ELECT REM PT RETURN 9FT PED (ELECTROSURGICAL)
ELECTRODE REM PT RETRN 9FT PED (ELECTROSURGICAL) IMPLANT
ELECTRODE REM PT RTRN 9FT ADLT (ELECTROSURGICAL) ×1 IMPLANT
GLOVE BIO SURGEON STRL SZ7.5 (GLOVE) ×3 IMPLANT
GLOVE SURG SS PI 7.0 STRL IVOR (GLOVE) ×3 IMPLANT
GOWN STRL REUS W/ TWL LRG LVL3 (GOWN DISPOSABLE) ×2 IMPLANT
GOWN STRL REUS W/TWL LRG LVL3 (GOWN DISPOSABLE) ×4
IV NS 500ML (IV SOLUTION) ×2
IV NS 500ML BAXH (IV SOLUTION) ×1 IMPLANT
MARKER SKIN DUAL TIP RULER LAB (MISCELLANEOUS) IMPLANT
NS IRRIG 1000ML POUR BTL (IV SOLUTION) ×3 IMPLANT
SHEET MEDIUM DRAPE 40X70 STRL (DRAPES) ×3 IMPLANT
SOLUTION BUTLER CLEAR DIP (MISCELLANEOUS) ×3 IMPLANT
SPONGE GAUZE 4X4 12PLY STER LF (GAUZE/BANDAGES/DRESSINGS) ×3 IMPLANT
SPONGE TONSIL 1 RF SGL (DISPOSABLE) IMPLANT
SPONGE TONSIL 1.25 RF SGL STRG (GAUZE/BANDAGES/DRESSINGS) ×3 IMPLANT
SYR BULB 3OZ (MISCELLANEOUS) ×3 IMPLANT
TOWEL OR 17X24 6PK STRL BLUE (TOWEL DISPOSABLE) ×3 IMPLANT
TUBE CONNECTING 20'X1/4 (TUBING) ×1
TUBE CONNECTING 20X1/4 (TUBING) ×2 IMPLANT
TUBE SALEM SUMP 12R W/ARV (TUBING) IMPLANT
TUBE SALEM SUMP 16 FR W/ARV (TUBING) ×3 IMPLANT
WAND COBLATOR 70 EVAC XTRA (SURGICAL WAND) ×3 IMPLANT

## 2016-10-11 NOTE — Op Note (Signed)
DATE OF PROCEDURE:  10/11/2016                              OPERATIVE REPORT  SURGEON:  Newman PiesSu Sylis Ketchum, MD  PREOPERATIVE DIAGNOSES: 1. Adenotonsillar hypertrophy. 2. Chronic tonsillitis and pharyngitis  POSTOPERATIVE DIAGNOSES: 1. Adenotonsillar hypertrophy. 2. Chronic tonsillitis and pharyngitis  PROCEDURE PERFORMED:  Adenotonsillectomy.  ANESTHESIA:  General endotracheal tube anesthesia.  COMPLICATIONS:  None.  ESTIMATED BLOOD LOSS:  Minimal.  INDICATION FOR PROCEDURE:  Angela Fisher is a 35 y.o. female with a history of chronic tonsillitis/pharyngitis and halitosis.  According to the patient, she has been experiencing chronic throat discomfort with halitosis for several years. The patient continues to be symptomatic despite medical treatments. On examination, the patient was noted to have bilateral cryptic tonsils, with numerous tonsilloliths. Based on the above findings, the decision was made for the patient to undergo the adenotonsillectomy procedure. Likelihood of success in reducing symptoms was also discussed.  The risks, benefits, alternatives, and details of the procedure were discussed with the patient.  Questions were invited and answered.  Informed consent was obtained.  DESCRIPTION:  The patient was taken to the operating room and placed supine on the operating table.  General endotracheal tube anesthesia was administered by the anesthesiologist.  The patient was positioned and prepped and draped in a standard fashion for adenotonsillectomy.  A Crowe-Davis mouth gag was inserted into the oral cavity for exposure. 2+ cryptic tonsils were noted bilaterally.  No bifidity was noted.  Indirect mirror examination of the nasopharynx revealed mild adenoid hypertrophy. The adenoid was ablated with the Coblator device. Hemostasis was achieved with the Coblator device.  The right tonsil was then grasped with a straight Allis clamp and retracted medially.  It was resected free from the  underlying pharyngeal constrictor muscles with the Coblator device.  The same procedure was repeated on the left side without exception.  The surgical sites were copiously irrigated.  The mouth gag was removed.  The care of the patient was turned over to the anesthesiologist.  The patient was awakened from anesthesia without difficulty.  The patient was extubated and transferred to the recovery room in good condition.  OPERATIVE FINDINGS:  Adenotonsillar hypertrophy.  SPECIMEN:  Bilateral tonsils  FOLLOWUP CARE:  The patient will be discharged home once awake and alert.  She will be placed on amoxicillin 800 mg p.o. b.i.d. for 5 days, and oxycodone 5-610ml po q 4 hours for postop pain control.   The patient will follow up in my office in approximately 2 weeks.  Moo Gravley,SUI W 10/11/2016 8:58 AM

## 2016-10-11 NOTE — Anesthesia Postprocedure Evaluation (Signed)
Anesthesia Post Note  Patient: Angela Fisher  Procedure(s) Performed: Procedure(s) (LRB): TONSILLECTOMY AND ADENOIDECTOMY (Bilateral)  Patient location during evaluation: PACU Anesthesia Type: General Level of consciousness: awake and alert Pain management: pain level controlled Vital Signs Assessment: post-procedure vital signs reviewed and stable Respiratory status: spontaneous breathing, nonlabored ventilation and respiratory function stable Cardiovascular status: blood pressure returned to baseline and stable Postop Assessment: no signs of nausea or vomiting Anesthetic complications: no    Last Vitals:  Vitals:   10/11/16 0930 10/11/16 1015  BP: (!) 118/100 133/69  Pulse: (!) 49 (!) 48  Resp: 16 14  Temp:  36.6 C    Last Pain:  Vitals:   10/11/16 1015  TempSrc:   PainSc: 5                  Shani Fitch A

## 2016-10-11 NOTE — Discharge Instructions (Addendum)
[facility] Postoperative Instructions for Tonsillectomy & Adenoidectomy (T&A) Activity Restrict activity at home for the first two days, resting as much as possible. Light indoor activity is best. You may usually return to school or work within a week but void strenuous activity and sports for two weeks. Sleep with your head elevated on 2-3 pillows for 3-4 days to help decrease swelling. Diet Due to tissue swelling and throat discomfort, you may have little desire to drink for several days. However fluids are very important to prevent dehydration. You will find that non-acidic juices, soups, popsicles, Jell-O, custard, puddings, and any soft or mashed foods taken in small quantities can be swallowed fairly easily. Try to increase your fluid and food intake as the discomfort subsides. It is recommended that a child receive 1-1/2 quarts of fluid in a 24-hour period. Adult require twice this amount.  Discomfort Your sore throat may be relieved by applying an ice collar to your neck and/or by taking Tylenol. You may experience an earache, which is due to referred pain from the throat. Referred ear pain is commonly felt at night when trying to rest.  Bleeding                       Although rare, there is risk of having some bleeding during the first 2 weeks after having a T&A. This usually happens between days 7-10 postoperatively. If you or your child should have any bleeding, try to remain calm. We recommend sitting up quietly in a chair and gently spitting out the blood into a bowl. For adults, gargling gently with ice water may help. If the bleeding does not stop after a short time (5 minutes), is more than 1 teaspoonful, or if you become worried, please call our office at [phone] or go directly to the nearest hospital emergency room. Do not eat or drink anything prior to going to the hospital as you may need to be taken to the operating room in order to control the bleeding. GENERAL  CONSIDERATIONS 1. Brush your teeth regularly. Avoid mouthwashes and gargles for three weeks. You may gargle gently with warm salt-water as necessary or spray with Chloraseptic. You may make salt-water by placing 2 teaspoons of table salt into a quart of fresh water. Warm the salt-water in a microwave to a luke warm temperature.  2.  3. Avoid exposure to colds and upper respiratory infections if possible.  4. If you look into a mirror or into your child's mouth, you will see white-gray patches in the back of the throat. This is normal after having a T&A and is like a scab that forms on the skin after an abrasion. It will disappear once the back of the throat heals completely. However, it may cause a noticeable odor; this too will disappear with time. Again, warm salt-water gargles may be used to help keep the throat clean and promote healing.  5. You may notice a temporary change in voice quality, such as a higher pitched voice or a nasal sound, until healing is complete. This may last for 1-2 weeks and should resolve.  6. Do not take or give you child any medications that we have not prescribed or recommended.  7. Snoring may occur, especially at night, for the first week after a T&A. It is due to swelling of the soft palate and will usually resolve.  Please call our office at [phone] if you have any questions.  Post Anesthesia Home Care Instructions  Activity: Get plenty of rest for the remainder of the day. A responsible adult should stay with you for 24 hours following the procedure.  For the next 24 hours, DO NOT: -Drive a car -Paediatric nurse -Drink alcoholic beverages -Take any medication unless instructed by your physician -Make any legal decisions or sign important papers.  Meals: Start with liquid foods such as gelatin or soup. Progress to regular foods as tolerated. Avoid greasy, spicy, heavy foods. If nausea and/or vomiting occur, drink only clear liquids until the  nausea and/or vomiting subsides. Call your physician if vomiting continues.  Special Instructions/Symptoms: Your throat may feel dry or sore from the anesthesia or the breathing tube placed in your throat during surgery. If this causes discomfort, gargle with warm salt water. The discomfort should disappear within 24 hours.  If you had a scopolamine patch placed behind your ear for the management of post- operative nausea and/or vomiting:  1. The medication in the patch is effective for 72 hours, after which it should be removed.  Wrap patch in a tissue and discard in the trash. Wash hands thoroughly with soap and water. 2. You may remove the patch earlier than 72 hours if you experience unpleasant side effects which may include dry mouth, dizziness or visual disturbances. 3. Avoid touching the patch. Wash your hands with soap and water after contact with the patch.

## 2016-10-11 NOTE — Transfer of Care (Signed)
Immediate Anesthesia Transfer of Care Note  Patient: Angela Fisher  Procedure(s) Performed: Procedure(s): TONSILLECTOMY AND ADENOIDECTOMY (Bilateral)  Patient Location: PACU  Anesthesia Type:General  Level of Consciousness: awake and alert   Airway & Oxygen Therapy: Patient Spontanous Breathing and Patient connected to face mask oxygen  Post-op Assessment: Report given to RN and Post -op Vital signs reviewed and stable  Post vital signs: Reviewed and stable  Last Vitals:  Vitals:   10/11/16 0717  BP: (!) 128/94  Pulse: 65  Resp: 18  Temp: 36.8 C    Last Pain:  Vitals:   10/11/16 0717  TempSrc: Oral      Patients Stated Pain Goal: 0 (10/11/16 0717)  Complications: No apparent anesthesia complications

## 2016-10-11 NOTE — H&P (Signed)
Cc: Frequent recurrent sore throat and tonsillitis  HPI: The patient is a 35 year old female who presents today complaining of frequent recurrent sore throat and tonsillitis.  [facility]  According to the patient, she has been symptomatic for almost 8 years. She has had 5 episodes of significant sore throat/pharyngitis over the past summer. She also complains of frequent tonsillith production, resulting in halitosis. The patient was previously treated with multiple antibiotics.  She has no previous ENT surgery.   The patient's review of systems (constitutional, eyes, ENT, cardiovascular, respiratory, GI, musculoskeletal, skin, neurologic, psychiatric, endocrine, hematologic, allergic) is noted in the ROS questionnaire.  It is reviewed with the patient.  Family health history: None. As noted on the Health questionnaire.  Major events: Hysterectomy.   Ongoing medical problems: Rosacea.   Social history: The patient is single. She is a former smoker. She denies the use of alcohol or illegal drugs.  Exam General: Communicates without difficulty, well nourished, no acute distress. Head: Normocephalic, no evidence injury, no tenderness, facial buttresses intact without stepoff. Face/sinus: No tenderness to palpation or percussion. Eyes: PERRL, EOMI. No scleral icterus, conjunctivae clear. Neuro: CN II exam reveals vision grossly intact.  No nystagmus at any point of gaze. Ears: Auricles well formed without lesions.  Ear canals are intact without mass or lesion.  No erythema or edema is appreciated.  The TMs are intact without fluid. Nose: External evaluation reveals normal support and skin without lesions.  Dorsum is intact.  Anterior rhinoscopy reveals healthy pink mucosa over anterior aspect of inferior turbinates and intact septum.  No purulence noted. Oral:  Oral cavity and oropharynx are intact, symmetric, without erythema or edema.   Mucosa is moist without lesions. 2+ cryptic tonsils. Neck: Full range of motion without pain.  There is no significant lymphadenopathy.  No masses palpable.  Thyroid bed within normal limits to palpation.  Parotid glands and submandibular glands equal bilaterally without mass.  Trachea is midline. Neuro:  CN 2-12 grossly intact. Gait normal.   Assessment 1.  The patient's history and physical exam findings are consistent with chronic tonsillitis/pharyngitis, secondary to adenotonsillar hypertrophy.   Plan 1.  The physical exam findings are reviewed with the patient.   2.  The treatment options are discussed.  The options include continuing conservative observation with medical therapy versus adenotonsillectomy.  The risks, benefits, alternatives and details of the treatment modalities are reviewed.  3.  The patient would like to proceed with the procedure.   4.  We will schedule the procedure in accordance with the patient's schedule.

## 2016-10-11 NOTE — Anesthesia Procedure Notes (Signed)
Procedure Name: Intubation Date/Time: 10/11/2016 8:29 AM Performed by: Caren MacadamARTER, Emira Eubanks W Pre-anesthesia Checklist: Patient identified, Emergency Drugs available, Suction available and Patient being monitored Patient Re-evaluated:Patient Re-evaluated prior to inductionOxygen Delivery Method: Circle system utilized Preoxygenation: Pre-oxygenation with 100% oxygen Intubation Type: IV induction Ventilation: Mask ventilation without difficulty Laryngoscope Size: Miller and 2 Grade View: Grade I Tube type: Oral Tube size: 7.0 mm Number of attempts: 1 Airway Equipment and Method: Stylet and Oral airway Placement Confirmation: ETT inserted through vocal cords under direct vision,  positive ETCO2 and breath sounds checked- equal and bilateral Secured at: 22 cm Tube secured with: Tape Dental Injury: Teeth and Oropharynx as per pre-operative assessment

## 2016-10-11 NOTE — Anesthesia Preprocedure Evaluation (Signed)
Anesthesia Evaluation  Patient identified by MRN, date of birth, ID band Patient awake    Reviewed: Allergy & Precautions, NPO status , Patient's Chart, lab work & pertinent test results  Airway Mallampati: I  TM Distance: >3 FB Neck ROM: Full    Dental  (+) Teeth Intact, Dental Advisory Given   Pulmonary    breath sounds clear to auscultation       Cardiovascular  Rhythm:Regular Rate:Normal     Neuro/Psych    GI/Hepatic GERD  Medicated and Controlled,  Endo/Other  Morbid obesity  Renal/GU      Musculoskeletal   Abdominal   Peds  Hematology   Anesthesia Other Findings   Reproductive/Obstetrics                            Anesthesia Physical Anesthesia Plan  ASA: II  Anesthesia Plan: General   Post-op Pain Management:    Induction: Intravenous  Airway Management Planned: Oral ETT  Additional Equipment:   Intra-op Plan:   Post-operative Plan: Extubation in OR  Informed Consent: I have reviewed the patients History and Physical, chart, labs and discussed the procedure including the risks, benefits and alternatives for the proposed anesthesia with the patient or authorized representative who has indicated his/her understanding and acceptance.   Dental advisory given  Plan Discussed with: CRNA, Anesthesiologist and Surgeon  Anesthesia Plan Comments:         Anesthesia Quick Evaluation  

## 2016-10-12 ENCOUNTER — Encounter (HOSPITAL_BASED_OUTPATIENT_CLINIC_OR_DEPARTMENT_OTHER): Payer: Self-pay | Admitting: Otolaryngology

## 2016-10-27 ENCOUNTER — Ambulatory Visit (INDEPENDENT_AMBULATORY_CARE_PROVIDER_SITE_OTHER): Payer: Medicaid Other | Admitting: Otolaryngology

## 2017-02-13 ENCOUNTER — Encounter: Payer: Self-pay | Admitting: Gastroenterology

## 2017-03-01 ENCOUNTER — Ambulatory Visit: Payer: Medicaid Other | Admitting: Nurse Practitioner

## 2017-03-20 ENCOUNTER — Ambulatory Visit (INDEPENDENT_AMBULATORY_CARE_PROVIDER_SITE_OTHER): Payer: Medicaid Other | Admitting: Gastroenterology

## 2017-03-20 ENCOUNTER — Telehealth: Payer: Self-pay

## 2017-03-20 ENCOUNTER — Encounter: Payer: Self-pay | Admitting: Gastroenterology

## 2017-03-20 DIAGNOSIS — R1031 Right lower quadrant pain: Secondary | ICD-10-CM | POA: Diagnosis not present

## 2017-03-20 DIAGNOSIS — K219 Gastro-esophageal reflux disease without esophagitis: Secondary | ICD-10-CM | POA: Diagnosis not present

## 2017-03-20 DIAGNOSIS — R197 Diarrhea, unspecified: Secondary | ICD-10-CM

## 2017-03-20 DIAGNOSIS — R630 Anorexia: Secondary | ICD-10-CM | POA: Insufficient documentation

## 2017-03-20 DIAGNOSIS — R634 Abnormal weight loss: Secondary | ICD-10-CM | POA: Diagnosis not present

## 2017-03-20 MED ORDER — PANTOPRAZOLE SODIUM 40 MG PO TBEC: 40.0000 mg | DELAYED_RELEASE_TABLET | Freq: Every day | ORAL | 11 refills | Status: DC

## 2017-03-20 NOTE — Telephone Encounter (Signed)
I called [facility] and spoke to pharmacist, and pt has been taking Omeprazole 20 mg qd.

## 2017-03-20 NOTE — Patient Instructions (Signed)
1. We will call in new medication to take place of your current acid reflux medication since it is not working well. We have to call [facility] and find out what you are on first. Please allow [place] 24 hours to call in the new medication.  2. I will review your prior CT scans and make a decision on next step in work up.

## 2017-03-20 NOTE — Telephone Encounter (Signed)
Noted  

## 2017-03-20 NOTE — Progress Notes (Signed)
Primary Care Physician:  Ernestine Conrad, MD  Primary Gastroenterologist:  Jonette Eva, MD   Chief Complaint  Patient presents with  . Abdominal Pain    lower mid abd when needed to have BM; pain is better since stopped red meats and greasy foods  . Gastroesophageal Reflux  . Emesis  . Nausea    at times    HPI:  Angela Fisher is a 36 y.o. female here at the request of PCP for further evaluation of GERD, abdominal pain, n/v, diarrhea.  Patient is a difficult historian.    Has had intermittent rlq pain with BM. Seems to happen when eats wrong thing. Ate undercooked chicken accidentally once and wonders if this started all of her symptoms. Denies acute diarrheal illness associated with this. If she eats red meat or greasy foods she tends to have lower abdominal pain. Fried chicken caused diarrhea. Steak caused vomiting. BM fine before all this. Now with loose stools up to 5-10 times daily. Stopped greasy foods, stools started hardening up. No melena. ?brbpr on toilet tissue but she is not sure. She thinks she has hemorrhoids. She has heartburn at times and takes TUMS. Lately more heartburn then usual. She is on PPI but cannot remember which one. Appetite poor. States she has lost 20 pounds since October when her tonsils were removed.    Confirmed with pharmacy, omeprazole  daily.  Current Outpatient Prescriptions  Medication Sig Dispense Refill  . omeprazole (PRILOSEC) 20 MG capsule Take 20 mg by mouth daily.     No current facility-administered medications for this visit.     Allergies as of 03/20/2017  . (No Known Allergies)    Past Medical History:  Diagnosis Date  . GERD (gastroesophageal reflux disease)    no current med.  . Psoriasis    left wrist, right arm  . Sensitive skin   . Tonsillar and adenoid hypertrophy 09/2016    Past Surgical History:  Procedure Laterality Date  . ABDOMINAL HYSTERECTOMY     partial  . DILATION AND CURETTAGE OF UTERUS    . ELBOW  SURGERY Left   . TONSILLECTOMY AND ADENOIDECTOMY Bilateral 10/11/2016   Procedure: TONSILLECTOMY AND ADENOIDECTOMY;  Surgeon: Newman Pies, MD;  Location: [facility];  Service: ENT;  Laterality: Bilateral;    Family History  Problem Relation Age of Onset  . Breast cancer Mother   . Colon cancer Neg Hx   . Inflammatory bowel disease Neg Hx     Social History   Social History  . Marital status: Single    Spouse name: N/A  . Number of children: N/A  . Years of education: N/A   Occupational History  . Not on file.   Social History Main Topics  . Smoking status: Never Smoker  . Smokeless tobacco: Never Used  . Alcohol use Yes     Comment: occasionally  . Drug use: No  . Sexual activity: Not on file   Other Topics Concern  . Not on file   Social History Narrative  . No narrative on file      ROS:  General: Negative for anorexia,   fever, chills, fatigue, weakness.see hpi Eyes: Negative for vision changes.  ENT: Negative for hoarseness, difficulty swallowing , nasal congestion. CV: Negative for chest pain, angina, palpitations, dyspnea on exertion, peripheral edema.  Respiratory: Negative for dyspnea at rest, dyspnea on exertion, cough, sputum, wheezing.  GI: See history of present illness. GU:  Negative for dysuria, hematuria, urinary  incontinence, urinary frequency, nocturnal urination.  MS: Negative for joint pain, low back pain.  Derm: Negative for rash or itching.  Neuro: Negative for weakness, abnormal sensation, seizure, frequent headaches, memory loss, confusion.  Psych: Negative for anxiety, depression, suicidal ideation, hallucinations.  Endo: see hpi Heme: Negative for bruising or bleeding. Allergy: Negative for rash or hives.    Physical Examination:  BP 111/74   Pulse 78   Temp 98.3 F (36.8 C) (Oral)   Ht 5' 7.5" (1.715 m)   Wt 192 lb 12.8 oz (87.5 kg)   BMI 29.75 kg/m    General: Well-nourished, well-developed in no acute distress.   Head: Normocephalic, atraumatic.   Eyes: Conjunctiva pink, no icterus. Mouth: Oropharyngeal mucosa moist and pink , no lesions erythema or exudate. Neck: Supple without thyromegaly, masses, or lymphadenopathy.  Lungs: Clear to auscultation bilaterally.  Heart: Regular rate and rhythm, no murmurs rubs or gallops.  Abdomen: Bowel sounds are normal, moderate lower abd tenderness, nondistended, no hepatosplenomegaly or masses, no abdominal bruits or    hernia , no rebound or guarding.   Rectal: not performed Extremities: No lower extremity edema. No clubbing or deformities.  Neuro: Alert and oriented x 4 , grossly normal neurologically.  Skin: Warm and dry, no rash or jaundice.   Psych: Alert and cooperative, normal mood and affect.  Labs: 01/25/17 WBC 6100, H/H 13.5/41.2, platelets 242000, cre 0.69, glu 73, tbili 0.2, alkphos 77, ast 17.1, alt 21 albumin 4.2   Imaging Studies: No results found.

## 2017-03-22 ENCOUNTER — Other Ambulatory Visit: Payer: Self-pay

## 2017-03-22 DIAGNOSIS — R634 Abnormal weight loss: Secondary | ICD-10-CM

## 2017-03-22 DIAGNOSIS — K5100 Ulcerative (chronic) pancolitis without complications: Secondary | ICD-10-CM

## 2017-03-22 DIAGNOSIS — R1032 Left lower quadrant pain: Secondary | ICD-10-CM

## 2017-03-22 MED ORDER — PEG 3350-KCL-NA BICARB-NACL 420 G PO SOLR: 4000.0000 mL | ORAL | 0 refills | Status: DC

## 2017-03-22 NOTE — Assessment & Plan Note (Signed)
36 year old lady with change in bowel habits, diarrhea and lower abdominal pain predominantly on the right. Associated intermittent vomiting, refractory reflux. She has lost 20 pounds since October. Some in part due to tonsillectomy. She reports several CT scans. We have requested those records. We have determined that she is on omeprazole and switching her to pantoprazole 40 mg daily. Once her CTs are reviewed, we'll likely schedule her for colonoscopy with possible upper endoscopy. Gallbladder is in situ but cannot explain all of her symptoms on possibly gb disease.

## 2017-03-22 NOTE — Progress Notes (Signed)
PT is aware. OK to schedule the procedures.

## 2017-03-22 NOTE — Progress Notes (Signed)
Pt called back and she is set up for TCS+/-EGD on 04/18/17 @ 1:00 pm. She is aware

## 2017-03-22 NOTE — Progress Notes (Signed)
CT A/P with contrast 04/2016 at [facility]: Wall thickening/edema involving the entire colon from cecum to rectum, hepatic steatosis. Widely patent visceral arteries. 7cm cyst right ovary, increase in size and recommended follow up.  Appendix normal and in right mid-abd. Cecum in ruq.  CT A/P with contrast 07/2014 at [facility]: Diffuse mild colonic wall thickening, 5.5cm benign appearing cyst right adnexa,   Please schedule for TCS+/-EGD with [facility] in OR. DX: h/o pancolitis on CTs dating back to 2015, diarrhea, lower abdominal pain, vomiting, refractory reflux, weight loss.

## 2017-03-22 NOTE — Progress Notes (Signed)
LMOM to call back

## 2017-04-10 NOTE — Patient Instructions (Signed)
Angela Fisher  04/10/2017     @   Your procedure is scheduled on 04/18/2017.  Report to [facility] at 11:30 A.M.  Call this number if you have problems the morning of surgery:  [phone]   Remember:  Do not eat food or drink liquids after midnight.  Take these medicines the morning of surgery with A SIP OF WATER Protonix   Do not wear jewelry, make-up or nail polish.  Do not wear lotions, powders, or perfumes, or deoderant.  Do not shave 48 hours prior to surgery.  Men may shave face and neck.  Do not bring valuables to the hospital.  [facility] is not responsible for any belongings or valuables.  Contacts, dentures or bridgework may not be worn into surgery.  Leave your suitcase in the car.  After surgery it may be brought to your room.  For patients admitted to the hospital, discharge time will be determined by your treatment team.  Patients discharged the day of surgery will not be allowed to drive home.    Please read over the following fact sheets that you were given. Anesthesia Post-op Instructions     PATIENT INSTRUCTIONS POST-ANESTHESIA  IMMEDIATELY FOLLOWING SURGERY:  Do not drive or operate machinery for the first twenty four hours after surgery.  Do not make any important decisions for twenty four hours after surgery or while taking narcotic pain medications or sedatives.  If you develop intractable nausea and vomiting or a severe headache please notify your doctor immediately.  FOLLOW-UP:  Please make an appointment with your surgeon as instructed. You do not need to follow up with anesthesia unless specifically instructed to do so.  WOUND CARE INSTRUCTIONS (if applicable):  Keep a dry clean dressing on the anesthesia/puncture wound site if there is drainage.  Once the wound has quit draining you may leave it open to air.  Generally you should leave the bandage intact for twenty four hours unless there is drainage.  If the epidural site  drains for more than 36-48 hours please call the anesthesia department.  QUESTIONS?:  Please feel free to call your physician or the hospital operator if you have any questions, and they will be happy to assist you.      Esophagogastroduodenoscopy Esophagogastroduodenoscopy (EGD) is a procedure to examine the lining of the esophagus, stomach, and first part of the small intestine (duodenum). This procedure is done to check for problems such as inflammation, bleeding, ulcers, or growths. During this procedure, a long, flexible, lighted tube with a camera attached (endoscope) is inserted down the throat. Tell a health care provider about:  Any allergies you have.  All medicines you are taking, including vitamins, herbs, eye drops, creams, and over-the-counter medicines.  Any problems you or family members have had with anesthetic medicines.  Any blood disorders you have.  Any surgeries you have had.  Any medical conditions you have.  Whether you are pregnant or may be pregnant. What are the risks? Generally, this is a safe procedure. However, problems may occur, including:  Infection.  Bleeding.  A tear (perforation) in the esophagus, stomach, or duodenum.  Trouble breathing.  Excessive sweating.  Spasms of the larynx.  A slowed heartbeat.  Low blood pressure. What happens before the procedure?  Follow instructions from your health care provider about eating or drinking restrictions.  Ask your health care provider about:  Changing or stopping your regular medicines. This is especially important if you are taking diabetes medicines  or blood thinners.  Taking medicines such as aspirin and ibuprofen. These medicines can thin your blood. Do not take these medicines before your procedure if your health care provider instructs you not to.  Plan to have someone take you home after the procedure.  If you wear dentures, be ready to remove them before the procedure. What  happens during the procedure?  To reduce your risk of infection, your health care team will wash or sanitize their hands.  An IV tube will be put in a vein in your hand or arm. You will get medicines and fluids through this tube.  You will be given one or more of the following:  A medicine to help you relax (sedative).  A medicine to numb the area (local anesthetic). This medicine may be sprayed into your throat. It will make you feel more comfortable and keep you from gagging or coughing during the procedure.  A medicine for pain.  A mouth guard may be placed in your mouth to protect your teeth and to keep you from biting on the endoscope.  You will be asked to lie on your left side.  The endoscope will be lowered down your throat into your esophagus, stomach, and duodenum.  Air will be put into the endoscope. This will help your health care provider see better.  The lining of your esophagus, stomach, and duodenum will be examined.  Your health care provider may:  Take a tissue sample so it can be looked at in a lab (biopsy).  Remove growths.  Remove objects (foreign bodies) that are stuck.  Treat any bleeding with medicines or other devices that stop tissue from bleeding.  Widen (dilate) or stretch narrowed areas of your esophagus and stomach.  The endoscope will be taken out. The procedure may vary among health care providers and hospitals. What happens after the procedure?  Your blood pressure, heart rate, breathing rate, and blood oxygen level will be monitored often until the medicines you were given have worn off.  Do not eat or drink anything until the numbing medicine has worn off and your gag reflex has returned. This information is not intended to replace advice given to you by your health care provider. Make sure you discuss any questions you have with your health care provider. Document Released: 03/31/2005 Document Revised: 05/05/2016 Document Reviewed:  10/22/2015 Elsevier Interactive Patient Education  2017 [employer] Colonoscopy, Adult A colonoscopy is an exam to look at the entire large intestine. During the exam, a lubricated, bendable tube is inserted into the anus and then passed into the rectum, colon, and other parts of the large intestine. A colonoscopy is often done as a part of normal colorectal screening or in response to certain symptoms, such as anemia, persistent diarrhea, abdominal pain, and blood in the stool. The exam can help screen for and diagnose medical problems, including:  Tumors.  Polyps.  Inflammation.  Areas of bleeding. Tell a health care provider about:  Any allergies you have.  All medicines you are taking, including vitamins, herbs, eye drops, creams, and over-the-counter medicines.  Any problems you or family members have had with anesthetic medicines.  Any blood disorders you have.  Any surgeries you have had.  Any medical conditions you have.  Any problems you have had passing stool. What are the risks? Generally, this is a safe procedure. However, problems may occur, including:  Bleeding.  A tear in the intestine.  A reaction to medicines given during the exam.  Infection (rare). What happens before the procedure? Eating and drinking restrictions  Follow instructions from your health care provider about eating and drinking, which may include:  A few days before the procedure - follow a low-fiber diet. Avoid nuts, seeds, dried fruit, raw fruits, and vegetables.  1-3 days before the procedure - follow a clear liquid diet. Drink only clear liquids, such as clear broth or bouillon, black coffee or tea, clear juice, clear soft drinks or sports drinks, gelatin dessert, and popsicles. Avoid any liquids that contain red or purple dye.  On the day of the procedure - do not eat or drink anything during the 2 hours before the procedure, or within the time period that your health care  provider recommends. Bowel prep  If you were prescribed an oral bowel prep to clean out your colon:  Take it as told by your health care provider. Starting the day before your procedure, you will need to drink a large amount of medicated liquid. The liquid will cause you to have multiple loose stools until your stool is almost clear or light green.  If your skin or anus gets irritated from diarrhea, you may use these to relieve the irritation:  Medicated wipes, such as adult wet wipes with aloe and vitamin E.  A skin soothing-product like petroleum jelly.  If you vomit while drinking the bowel prep, take a break for up to 60 minutes and then begin the bowel prep again. If vomiting continues and you cannot take the bowel prep without vomiting, call your health care provider. General instructions   Ask your health care provider about changing or stopping your regular medicines. This is especially important if you are taking diabetes medicines or blood thinners.  Plan to have someone take you home from the hospital or clinic. What happens during the procedure?  An IV tube may be inserted into one of your veins.  You will be given medicine to help you relax (sedative).  To reduce your risk of infection:  Your health care team will wash or sanitize their hands.  Your anal area will be washed with soap.  You will be asked to lie on your side with your knees bent.  Your health care provider will lubricate a long, thin, flexible tube. The tube will have a camera and a light on the end.  The tube will be inserted into your anus.  The tube will be gently eased through your rectum and colon.  Air will be delivered into your colon to keep it open. You may feel some pressure or cramping.  The camera will be used to take images during the procedure.  A small tissue sample may be removed from your body to be examined under a microscope (biopsy). If any potential problems are found, the  tissue will be sent to a lab for testing.  If small polyps are found, your health care provider may remove them and have them checked for cancer cells.  The tube that was inserted into your anus will be slowly removed. The procedure may vary among health care providers and hospitals. What happens after the procedure?  Your blood pressure, heart rate, breathing rate, and blood oxygen level will be monitored until the medicines you were given have worn off.  Do not drive for 24 hours after the exam.  You may have a small amount of blood in your stool.  You may pass gas and have mild abdominal cramping or bloating due to the air  that was used to inflate your colon during the exam.  It is up to you to get the results of your procedure. Ask your health care provider, or the department performing the procedure, when your results will be ready. This information is not intended to replace advice given to you by your health care provider. Make sure you discuss any questions you have with your health care provider. Document Released: 11/25/2000 Document Revised: 09/28/2016 Document Reviewed: 02/09/2016 Elsevier Interactive Patient Education  2017 [employer].

## 2017-04-13 ENCOUNTER — Encounter (HOSPITAL_COMMUNITY)
Admission: RE | Admit: 2017-04-13 | Discharge: 2017-04-13 | Disposition: A | Discharge status: Home / Self Care | Payer: Medicaid Other | Source: Ambulatory Visit | Attending: Gastroenterology | Admitting: Gastroenterology

## 2017-04-14 ENCOUNTER — Encounter (HOSPITAL_COMMUNITY): Payer: Self-pay

## 2017-04-14 ENCOUNTER — Encounter (HOSPITAL_COMMUNITY)
Admission: RE | Admit: 2017-04-14 | Discharge: 2017-04-14 | Disposition: A | Discharge status: Home / Self Care | Payer: Medicaid Other | Source: Ambulatory Visit | Attending: Gastroenterology | Admitting: Gastroenterology

## 2017-04-14 DIAGNOSIS — Z01818 Encounter for other preprocedural examination: Secondary | ICD-10-CM | POA: Diagnosis present

## 2017-04-14 DIAGNOSIS — Z01812 Encounter for preprocedural laboratory examination: Secondary | ICD-10-CM | POA: Insufficient documentation

## 2017-04-14 LAB — CBC WITH DIFFERENTIAL/PLATELET
BASOS ABS: 0 10*3/uL (ref 0.0–0.1)
BASOS PCT: 1 %
EOS ABS: 0.1 10*3/uL (ref 0.0–0.7)
EOS PCT: 2 %
HCT: 39.2 % (ref 36.0–46.0)
Hemoglobin: 12.8 g/dL (ref 12.0–15.0)
LYMPHS ABS: 2.2 10*3/uL (ref 0.7–4.0)
Lymphocytes Relative: 34 %
MCH: 28.5 pg (ref 26.0–34.0)
MCHC: 32.7 g/dL (ref 30.0–36.0)
MCV: 87.3 fL (ref 78.0–100.0)
Monocytes Absolute: 0.5 10*3/uL (ref 0.1–1.0)
Monocytes Relative: 8 %
Neutro Abs: 3.5 10*3/uL (ref 1.7–7.7)
Neutrophils Relative %: 55 %
PLATELETS: 210 10*3/uL (ref 150–400)
RBC: 4.49 MIL/uL (ref 3.87–5.11)
RDW: 13.2 % (ref 11.5–15.5)
WBC: 6.3 10*3/uL (ref 4.0–10.5)

## 2017-04-14 LAB — BASIC METABOLIC PANEL
Anion gap: 8 (ref 5–15)
BUN: 7 mg/dL (ref 6–20)
CALCIUM: 8.6 mg/dL — AB (ref 8.9–10.3)
CO2: 23 mmol/L (ref 22–32)
CREATININE: 0.76 mg/dL (ref 0.44–1.00)
Chloride: 105 mmol/L (ref 101–111)
Glucose, Bld: 86 mg/dL (ref 65–99)
Potassium: 3.8 mmol/L (ref 3.5–5.1)
SODIUM: 136 mmol/L (ref 135–145)

## 2017-04-14 LAB — RAPID URINE DRUG SCREEN, HOSP PERFORMED
AMPHETAMINES: NOT DETECTED
Barbiturates: NOT DETECTED
Benzodiazepines: NOT DETECTED
COCAINE: NOT DETECTED
OPIATES: NOT DETECTED
TETRAHYDROCANNABINOL: POSITIVE — AB

## 2017-04-17 NOTE — Progress Notes (Signed)
Noted. Procedure as planned.

## 2017-04-17 NOTE — Progress Notes (Signed)
Forwarding to Tana CoastLeslie Lewis, PA in Dr. Darrick PennaFields absence. PT is on schedule for procedure tomorrow. Please advise!

## 2017-04-18 ENCOUNTER — Telehealth: Payer: Self-pay | Admitting: Gastroenterology

## 2017-04-18 ENCOUNTER — Encounter (HOSPITAL_COMMUNITY): Admission: RE | Payer: Self-pay | Source: Ambulatory Visit

## 2017-04-18 ENCOUNTER — Telehealth: Payer: Self-pay

## 2017-04-18 ENCOUNTER — Ambulatory Visit (HOSPITAL_COMMUNITY): Admission: RE | Admit: 2017-04-18 | Payer: Medicaid Other | Source: Ambulatory Visit | Admitting: Gastroenterology

## 2017-04-18 SURGERY — COLONOSCOPY WITH PROPOFOL
Anesthesia: Monitor Anesthesia Care

## 2017-04-18 NOTE — Progress Notes (Signed)
REVIEWED-NO ADDITIONAL RECOMMENDATIONS. 

## 2017-04-18 NOTE — Telephone Encounter (Signed)
REVIEWED. AGREE. NO ADDITIONAL RECOMMENDATIONS. 

## 2017-04-18 NOTE — Telephone Encounter (Signed)
Pt called to say that she doesn't remember if she ate or not and she didn't do the bowel prep because she lost a diamond necklace yesterday and it "threw her off". She is wanting to reschedule, but needs an OV prior to rescheduling. She is aware that she has appt with us on 05/22/2017 at 11am.

## 2017-04-18 NOTE — Telephone Encounter (Signed)
Pt called  to reschedule her procedure that was supposed to be today. She lost her diamond necklace and forgot to do her prep. I told her that she would have to come back into the office to get rescheduled.

## 2017-04-24 ENCOUNTER — Encounter: Payer: Self-pay | Admitting: General Practice

## 2017-04-24 NOTE — Progress Notes (Signed)
I called and informed pt. She said is that all you have got to tell me. I told her that is what I was asked to tell her. She said she has changed her mind, she does not want any doctor going up her A hole and she is just sick and tired of doctors wanting to do things to her. She kept going over the same thing, and I told her that I was only asked to give her a message and if she wants to discuss with doctor she can do so.

## 2017-05-22 ENCOUNTER — Ambulatory Visit: Payer: Medicaid Other | Admitting: Gastroenterology

## 2022-05-20 ENCOUNTER — Encounter (INDEPENDENT_AMBULATORY_CARE_PROVIDER_SITE_OTHER): Payer: Self-pay | Admitting: *Deleted

## 2022-06-30 ENCOUNTER — Telehealth (INDEPENDENT_AMBULATORY_CARE_PROVIDER_SITE_OTHER): Payer: Self-pay

## 2022-06-30 ENCOUNTER — Encounter (INDEPENDENT_AMBULATORY_CARE_PROVIDER_SITE_OTHER): Payer: Self-pay | Admitting: Gastroenterology

## 2022-06-30 ENCOUNTER — Encounter (INDEPENDENT_AMBULATORY_CARE_PROVIDER_SITE_OTHER): Payer: Self-pay

## 2022-06-30 ENCOUNTER — Ambulatory Visit (INDEPENDENT_AMBULATORY_CARE_PROVIDER_SITE_OTHER): Payer: Medicaid Other | Admitting: Gastroenterology

## 2022-06-30 ENCOUNTER — Other Ambulatory Visit (INDEPENDENT_AMBULATORY_CARE_PROVIDER_SITE_OTHER): Payer: Self-pay

## 2022-06-30 DIAGNOSIS — K625 Hemorrhage of anus and rectum: Secondary | ICD-10-CM | POA: Diagnosis not present

## 2022-06-30 DIAGNOSIS — K5900 Constipation, unspecified: Secondary | ICD-10-CM

## 2022-06-30 MED ORDER — PEG 3350-KCL-NA BICARB-NACL 420 G PO SOLR: 4000.0000 mL | ORAL | 0 refills | Status: DC

## 2022-06-30 NOTE — Patient Instructions (Addendum)
Schedule colonoscopy Continue currnet medications but may stop multivitamins or colon cleanser for a few days if having more than 4 bowel movements every day

## 2022-06-30 NOTE — Progress Notes (Signed)
Angela Fisher, M.D. Gastroenterology & Hepatology [facility] [address] [place], [place] [address] Primary Care Physician: Hasanaj, Xaje A, MD [address] [place] [address]  Referring MD: Kirk Bluth, MD  Chief Complaint: Rectal bleeding  History of Present Illness: Angela Fisher is a 41 y.o. female with past medical history of GERD, psoriasis, who presents for evaluation of rectal bleeding.  Patient reports that in February 2021 she presented an episode of fresh blood in her stools, which was self limited.  She reported that these only last 1 day but did not seek any medical care for this. States that two months ago she had again an episode of large amount of blood in her stools. She states that it only happened one time when she moved her bowels. She did not have dyschezia or abdominal pain.    She has had a chronic history of constipation for multiple years - states that she very rarely passes gas. She had a BM possibly once a week. She noticed that last year she started taking "[employer]" multivitamins which made her have more frequent bowel movements, as well as when taking "a natrual colon cleanser daily" - this has led to having 2-3 Bms per day, usually the first BM is hard and then the rest are soft.  The patient denies having any nausea, vomiting, fever, chills, melena, hematemesis, abdominal distention, abdominal pain, diarrhea, jaundice, pruritus or weight loss.  Last EGD:never Last Colonoscopy:never  FHx: neg for any gastrointestinal/liver disease, mother breast cancer Social: neg smoking, alcohol or illicit drug use Surgical: hysterectomy  Past Medical History: Past Medical History:  Diagnosis Date   GERD (gastroesophageal reflux disease)    no current med.   Psoriasis    left wrist, right arm   Sensitive skin    Tonsillar and adenoid hypertrophy 09/2016    Past Surgical History: Past Surgical  History:  Procedure Laterality Date   ABDOMINAL HYSTERECTOMY     partial   DILATION AND CURETTAGE OF UTERUS     ELBOW SURGERY Left    TONSILLECTOMY AND ADENOIDECTOMY Bilateral 10/11/2016   Procedure: TONSILLECTOMY AND ADENOIDECTOMY;  Surgeon: Su Teoh, MD;  Location: [facility];  Service: ENT;  Laterality: Bilateral;    Family History: Family History  Problem Relation Age of Onset   Breast cancer Mother    Colon cancer Neg Hx    Inflammatory bowel disease Neg Hx     Social History: Social History   Tobacco Use  Smoking Status Every Day   Types: Cigarettes, E-cigarettes   Passive exposure: Current  Smokeless Tobacco Never   Social History   Substance and Sexual Activity  Alcohol Use Yes   Comment: occasionally   Social History   Substance and Sexual Activity  Drug Use No    Allergies: No Known Allergies  Medications: Current Outpatient Medications  Medication Sig Dispense Refill   Biotin w/ Vitamins C & E (HAIR/SKIN/NAILS PO) Take by mouth. 5,000 mg daily     Cholecalciferol (VITAMIN D3) 250 MCG (10000 UT) TABS Take by mouth daily.     Ginger, Zingiber officinalis, (GINGER ROOT) 550 MG CAPS Take by mouth daily.     Misc Natural Products (COLON CLEANSER PO) Take by mouth daily.     OVER THE COUNTER MEDICATION Night time sleep aid and pain relief 500mg     No current facility-administered medications for this visit.    Review of Systems: GENERAL: negative   for malaise, night sweats HEENT: No changes in hearing or vision, no nose bleeds or other nasal problems. NECK: Negative for lumps, goiter, pain and significant neck swelling RESPIRATORY: Negative for cough, wheezing CARDIOVASCULAR: Negative for chest pain, leg swelling, palpitations, orthopnea GI: SEE HPI MUSCULOSKELETAL: Negative for joint pain or swelling, back pain, and muscle pain. SKIN: Negative for lesions, rash PSYCH: Negative for sleep disturbance, mood disorder and recent  psychosocial stressors. HEMATOLOGY Negative for prolonged bleeding, bruising easily, and swollen nodes. ENDOCRINE: Negative for cold or heat intolerance, polyuria, polydipsia and goiter. NEURO: negative for tremor, gait imbalance, syncope and seizures. The remainder of the review of systems is noncontributory.   Physical Exam: BP 122/83 (BP Location: Right Arm, Patient Position: Sitting, Cuff Size: Large)   Pulse 71   Temp 97.8 F (36.6 C) (Oral)   Ht 5' 7.5" (1.715 m)   Wt 177 lb (80.3 kg)   BMI 27.31 kg/m  GENERAL: The patient is AO x3, in no acute distress. HEENT: Head is normocephalic and atraumatic. EOMI are intact. Mouth is well hydrated and without lesions. NECK: Supple. No masses LUNGS: Clear to auscultation. No presence of rhonchi/wheezing/rales. Adequate chest expansion HEART: RRR, normal s1 and s2. ABDOMEN: Soft, nontender, no guarding, no peritoneal signs, and nondistended. BS +. No masses. EXTREMITIES: Without any cyanosis, clubbing, rash, lesions or edema. NEUROLOGIC: AOx3, no focal motor deficit. SKIN: no jaundice, no rashes   Imaging/Labs: as above  I personally reviewed and interpreted the available labs, imaging and endoscopic files.  Impression and Plan: Angela Fisher is a 41 y.o. female with past medical history of GERD, psoriasis, who presents for evaluation of rectal bleeding.  Patient has been noted to isolated episodes of rectal bleeding.  She has not presented any red flag signs but given the recurrence of her episode of bleeding we will need to evaluate this further with a colonoscopy.  Etiology includes possible hemorrhoidal versus diverticular bleeding, less likely due to malignancy.  She can continue taking her current multivitamins and laxatives daily as this has led to improvement of her constipation.  - Schedule colonoscopy - Continue current medications but may stop multivitamins or colon cleanser for a few days if having more than 4 bowel  movements every day  All questions were answered.      [name], MD Gastroenterology and Hepatology [facility]

## 2022-06-30 NOTE — H&P (View-Only) (Signed)
[name], M.D. Gastroenterology & Hepatology [facility] [address] [place], [place] [address] Primary Care Physician: Toma Deiters, MD [address] [place] [address]  Referring MD: Ernestine Conrad, MD  Chief Complaint: Rectal bleeding  History of Present Illness: Angela Fisher is a 41 y.o. female with past medical history of GERD, psoriasis, who presents for evaluation of rectal bleeding.  Patient reports that in February 2021 she presented an episode of fresh blood in her stools, which was self limited.  She reported that these only last 1 day but did not seek any medical care for this. States that two months ago she had again an episode of large amount of blood in her stools. She states that it only happened one time when she moved her bowels. She did not have dyschezia or abdominal pain.    She has had a chronic history of constipation for multiple years - states that she very rarely passes gas. She had a BM possibly once a week. She noticed that last year she started taking "[employer]" multivitamins which made her have more frequent bowel movements, as well as when taking "a natrual colon cleanser daily" - this has led to having 2-3 Bms per day, usually the first BM is hard and then the rest are soft.  The patient denies having any nausea, vomiting, fever, chills, melena, hematemesis, abdominal distention, abdominal pain, diarrhea, jaundice, pruritus or weight loss.  Last PXT:GGYIR Last Colonoscopy:never  FHx: neg for any gastrointestinal/liver disease, mother breast cancer Social: neg smoking, alcohol or illicit drug use Surgical: hysterectomy  Past Medical History: Past Medical History:  Diagnosis Date   GERD (gastroesophageal reflux disease)    no current med.   Psoriasis    left wrist, right arm   Sensitive skin    Tonsillar and adenoid hypertrophy 09/2016    Past Surgical History: Past Surgical  History:  Procedure Laterality Date   ABDOMINAL HYSTERECTOMY     partial   DILATION AND CURETTAGE OF UTERUS     ELBOW SURGERY Left    TONSILLECTOMY AND ADENOIDECTOMY Bilateral 10/11/2016   Procedure: TONSILLECTOMY AND ADENOIDECTOMY;  Surgeon: Newman Pies, MD;  Location: [facility];  Service: ENT;  Laterality: Bilateral;    Family History: Family History  Problem Relation Age of Onset   Breast cancer Mother    Colon cancer Neg Hx    Inflammatory bowel disease Neg Hx     Social History: Social History   Tobacco Use  Smoking Status Every Day   Types: Cigarettes, E-cigarettes   Passive exposure: Current  Smokeless Tobacco Never   Social History   Substance and Sexual Activity  Alcohol Use Yes   Comment: occasionally   Social History   Substance and Sexual Activity  Drug Use No    Allergies: No Known Allergies  Medications: Current Outpatient Medications  Medication Sig Dispense Refill   Biotin w/ Vitamins C & E (HAIR/SKIN/NAILS PO) Take by mouth. 5,000 mg daily     Cholecalciferol (VITAMIN D3) 250 MCG (10000 UT) TABS Take by mouth daily.     Ginger, Zingiber officinalis, (GINGER ROOT) 550 MG CAPS Take by mouth daily.     Misc Natural Products (COLON CLEANSER PO) Take by mouth daily.     OVER THE COUNTER MEDICATION Night time sleep aid and pain relief 500mg      No current facility-administered medications for this visit.    Review of Systems: GENERAL: negative  for malaise, night sweats HEENT: No changes in hearing or vision, no nose bleeds or other nasal problems. NECK: Negative for lumps, goiter, pain and significant neck swelling RESPIRATORY: Negative for cough, wheezing CARDIOVASCULAR: Negative for chest pain, leg swelling, palpitations, orthopnea GI: SEE HPI MUSCULOSKELETAL: Negative for joint pain or swelling, back pain, and muscle pain. SKIN: Negative for lesions, rash PSYCH: Negative for sleep disturbance, mood disorder and recent  psychosocial stressors. HEMATOLOGY Negative for prolonged bleeding, bruising easily, and swollen nodes. ENDOCRINE: Negative for cold or heat intolerance, polyuria, polydipsia and goiter. NEURO: negative for tremor, gait imbalance, syncope and seizures. The remainder of the review of systems is noncontributory.   Physical Exam: BP 122/83 (BP Location: Right Arm, Patient Position: Sitting, Cuff Size: Large)   Pulse 71   Temp 97.8 F (36.6 C) (Oral)   Ht 5' 7.5" (1.715 m)   Wt 177 lb (80.3 kg)   BMI 27.31 kg/m  GENERAL: The patient is AO x3, in no acute distress. HEENT: Head is normocephalic and atraumatic. EOMI are intact. Mouth is well hydrated and without lesions. NECK: Supple. No masses LUNGS: Clear to auscultation. No presence of rhonchi/wheezing/rales. Adequate chest expansion HEART: RRR, normal s1 and s2. ABDOMEN: Soft, nontender, no guarding, no peritoneal signs, and nondistended. BS +. No masses. EXTREMITIES: Without any cyanosis, clubbing, rash, lesions or edema. NEUROLOGIC: AOx3, no focal motor deficit. SKIN: no jaundice, no rashes   Imaging/Labs: as above  I personally reviewed and interpreted the available labs, imaging and endoscopic files.  Impression and Plan: Angela Fisher is a 41 y.o. female with past medical history of GERD, psoriasis, who presents for evaluation of rectal bleeding.  Patient has been noted to isolated episodes of rectal bleeding.  She has not presented any red flag signs but given the recurrence of her episode of bleeding we will need to evaluate this further with a colonoscopy.  Etiology includes possible hemorrhoidal versus diverticular bleeding, less likely due to malignancy.  She can continue taking her current multivitamins and laxatives daily as this has led to improvement of her constipation.  - Schedule colonoscopy - Continue current medications but may stop multivitamins or colon cleanser for a few days if having more than 4 bowel  movements every day  All questions were answered.      [name], MD Gastroenterology and Hepatology [facility]

## 2022-06-30 NOTE — Telephone Encounter (Signed)
Angela Fisher Angela Fisher, CMA  ?

## 2022-07-21 ENCOUNTER — Ambulatory Visit (HOSPITAL_COMMUNITY): Payer: Medicaid Other | Admitting: Anesthesiology

## 2022-07-21 ENCOUNTER — Ambulatory Visit (HOSPITAL_COMMUNITY)
Admission: RE | Admit: 2022-07-21 | Discharge: 2022-07-21 | Disposition: A | Discharge status: Home / Self Care | Payer: Medicaid Other | Source: Ambulatory Visit | Attending: Gastroenterology | Admitting: Gastroenterology

## 2022-07-21 ENCOUNTER — Other Ambulatory Visit: Payer: Self-pay

## 2022-07-21 ENCOUNTER — Encounter (HOSPITAL_COMMUNITY)
Admission: RE | Disposition: A | Discharge status: Home / Self Care | Payer: Self-pay | Source: Ambulatory Visit | Attending: Gastroenterology

## 2022-07-21 ENCOUNTER — Ambulatory Visit (HOSPITAL_BASED_OUTPATIENT_CLINIC_OR_DEPARTMENT_OTHER): Payer: Medicaid Other | Admitting: Anesthesiology

## 2022-07-21 ENCOUNTER — Encounter (HOSPITAL_COMMUNITY): Payer: Self-pay | Admitting: Gastroenterology

## 2022-07-21 DIAGNOSIS — K5730 Diverticulosis of large intestine without perforation or abscess without bleeding: Secondary | ICD-10-CM

## 2022-07-21 DIAGNOSIS — F17200 Nicotine dependence, unspecified, uncomplicated: Secondary | ICD-10-CM | POA: Diagnosis not present

## 2022-07-21 DIAGNOSIS — K625 Hemorrhage of anus and rectum: Secondary | ICD-10-CM | POA: Diagnosis not present

## 2022-07-21 DIAGNOSIS — F419 Anxiety disorder, unspecified: Secondary | ICD-10-CM | POA: Diagnosis not present

## 2022-07-21 DIAGNOSIS — K644 Residual hemorrhoidal skin tags: Secondary | ICD-10-CM | POA: Diagnosis not present

## 2022-07-21 DIAGNOSIS — K219 Gastro-esophageal reflux disease without esophagitis: Secondary | ICD-10-CM | POA: Insufficient documentation

## 2022-07-21 HISTORY — PX: COLONOSCOPY WITH PROPOFOL: SHX5780

## 2022-07-21 LAB — HM COLONOSCOPY

## 2022-07-21 SURGERY — COLONOSCOPY WITH PROPOFOL
Anesthesia: General

## 2022-07-21 MED ORDER — PROPOFOL 500 MG/50ML IV EMUL
INTRAVENOUS | Status: DC | PRN
  Administered 2022-07-21: 150 ug/kg/min via INTRAVENOUS

## 2022-07-21 MED ORDER — PROPOFOL 10 MG/ML IV BOLUS
INTRAVENOUS | Status: DC | PRN
  Administered 2022-07-21: 100 mg via INTRAVENOUS

## 2022-07-21 MED ORDER — LACTATED RINGERS IV SOLN
INTRAVENOUS | Status: DC
Start: 2022-07-21 — End: 2022-07-21

## 2022-07-21 NOTE — Anesthesia Postprocedure Evaluation (Signed)
Anesthesia Post Note  Patient: Angela Fisher  Procedure(s) Performed: COLONOSCOPY WITH PROPOFOL  Patient location during evaluation: Phase II Anesthesia Type: General Level of consciousness: awake and alert and oriented Pain management: pain level controlled Vital Signs Assessment: post-procedure vital signs reviewed and stable Respiratory status: spontaneous breathing, nonlabored ventilation and respiratory function stable Cardiovascular status: blood pressure returned to baseline and stable Postop Assessment: no apparent nausea or vomiting Anesthetic complications: no   No notable events documented.   Last Vitals:  Vitals:   07/21/22 1239 07/21/22 1500  BP: 117/83 99/66  Pulse: 70 66  Resp: 17 15  Temp: 37 C 36.8 C  SpO2: 99% 100%    Last Pain:  Vitals:   07/21/22 1500  TempSrc: Oral  PainSc: 0-No pain                 Josue Kass C Saphronia Ozdemir

## 2022-07-21 NOTE — Anesthesia Preprocedure Evaluation (Addendum)
Anesthesia Evaluation  Patient identified by MRN, date of birth, ID band Patient awake    Reviewed: Allergy & Precautions, NPO status , Patient's Chart, lab work & pertinent test results  Airway Mallampati: II  TM Distance: >3 FB Neck ROM: Full    Dental  (+) Dental Advisory Given, Missing   Pulmonary Current Smoker and Patient abstained from smoking.,    Pulmonary exam normal breath sounds clear to auscultation       Cardiovascular negative cardio ROS Normal cardiovascular exam Rhythm:Regular Rate:Normal     Neuro/Psych  Headaches, PSYCHIATRIC DISORDERS Anxiety    GI/Hepatic Neg liver ROS, GERD (takes tums)  Controlled,  Endo/Other  negative endocrine ROS  Renal/GU negative Renal ROS  negative genitourinary   Musculoskeletal negative musculoskeletal ROS (+)   Abdominal   Peds negative pediatric ROS (+)  Hematology negative hematology ROS (+)   Anesthesia Other Findings   Reproductive/Obstetrics negative OB ROS                            Anesthesia Physical Anesthesia Plan  ASA: 2  Anesthesia Plan: General   Post-op Pain Management: Minimal or no pain anticipated   Induction: Intravenous  PONV Risk Score and Plan: Propofol infusion  Airway Management Planned: Nasal Cannula and Natural Airway  Additional Equipment:   Intra-op Plan:   Post-operative Plan:   Informed Consent: I have reviewed the patients History and Physical, chart, labs and discussed the procedure including the risks, benefits and alternatives for the proposed anesthesia with the patient or authorized representative who has indicated his/her understanding and acceptance.     Dental advisory given  Plan Discussed with: CRNA and Surgeon  Anesthesia Plan Comments:         Anesthesia Quick Evaluation

## 2022-07-21 NOTE — Discharge Instructions (Signed)
-   Discharge patient to home (ambulatory).  - Resume previous diet.  - Repeat colonoscopy in 10 years for screening purposes.  - Continue current laxative regimen.

## 2022-07-21 NOTE — Transfer of Care (Signed)
Immediate Anesthesia Transfer of Care Note  Patient: Angela Fisher  Procedure(s) Performed: COLONOSCOPY WITH PROPOFOL  Patient Location: Endoscopy Unit  Anesthesia Type:General  Level of Consciousness: awake, alert  and oriented  Airway & Oxygen Therapy: Patient Spontanous Breathing  Post-op Assessment: Report given to RN and Post -op Vital signs reviewed and stable  Post vital signs: Reviewed and stable  Last Vitals:  Vitals Value Taken Time  BP 99/66 07/21/22 1500  Temp 36.8 C 07/21/22 1500  Pulse 66 07/21/22 1500  Resp 15 07/21/22 1500  SpO2 100 % 07/21/22 1500    Last Pain:  Vitals:   07/21/22 1500  TempSrc: Oral  PainSc: 0-No pain      Patients Stated Pain Goal: 10 (07/21/22 1226)  Complications: No notable events documented.

## 2022-07-21 NOTE — Op Note (Signed)
[facility] Patient Name: Angela Fisher Procedure Date: 07/21/2022 2:32 PM MRN: [id number] Date of Birth: [birth year] Attending MD: [name] ,  CSN: [id number] Age: 41 Admit Type: Outpatient Procedure:                Colonoscopy Indications:              Rectal bleeding Providers:                [name], Edrick Kins, RN, Angelica Ran,                            Lennice Sites Technician, Technician Referring MD:              Medicines:                Monitored Anesthesia Care Complications:            No immediate complications. Estimated Blood Loss:     Estimated blood loss: none. Procedure:                Pre-Anesthesia Assessment:                           - Prior to the procedure, a History and Physical                            was performed, and patient medications, allergies                            and sensitivities were reviewed. The patient's                            tolerance of previous anesthesia was reviewed.                           - The risks and benefits of the procedure and the                            sedation options and risks were discussed with the                            patient. All questions were answered and informed                            consent was obtained.                           - ASA Grade Assessment: II - A patient with mild                            systemic disease.                           After obtaining informed consent, the colonoscope                            was passed under direct vision. Throughout the  procedure, the patient's blood pressure, pulse, and                            oxygen saturations were monitored continuously. The                            [id number]-[id number] ([id number]) scope was introduced through                            the anus and advanced to the the terminal ileum.                            The colonoscopy was performed without difficulty.                             The patient tolerated the procedure well. The                            quality of the bowel preparation was good. Scope In: 2:43:20 PM Scope Out: 2:58:07 PM Scope Withdrawal Time: 0 hours 11 minutes 26 seconds  Total Procedure Duration: 0 hours 14 minutes 47 seconds  Findings:      Hemorrhoids were found on perianal exam.      A few small and large-mouthed diverticula were found in the sigmoid       colon, descending colon and ascending colon.      The terminal ileum appeared normal.      Non-bleeding external hemorrhoids were found during perianal exam. The       hemorrhoids were medium-sized. Impression:               - Hemorrhoids found on perianal exam.                           - Diverticulosis in the sigmoid colon, in the                            descending colon and in the ascending colon.                           - The examined portion of the ileum was normal.                           - Non-bleeding external hemorrhoids.                           - No specimens collected. Moderate Sedation:      Per Anesthesia Care Recommendation:           - Discharge patient to home (ambulatory).                           - Resume previous diet.                           - Repeat colonoscopy in 10 years for screening  purposes.                           - Continue current laxative regimen. Procedure Code(s):        --- Professional ---                           [phone], Colonoscopy, flexible; diagnostic, including                            collection of specimen(s) by brushing or washing,                            when performed (separate procedure) Diagnosis Code(s):        --- Professional ---                           K64.4, Residual hemorrhoidal skin tags                           K62.5, Hemorrhage of anus and rectum                           K57.30, Diverticulosis of large intestine without                            perforation or abscess  without bleeding CPT copyright 2019 American Medical Association. All rights reserved. The codes documented in this report are preliminary and upon coder review may  be revised to meet current compliance requirements. [name], MD [name],  07/21/2022 3:02:41 PM This report has been signed electronically. Number of Addenda: 0

## 2022-07-21 NOTE — Interval H&P Note (Signed)
History and Physical Interval Note:  07/21/2022 12:49 PM  Angela Fisher  has presented today for surgery, with the diagnosis of Rectal bleeding.  The various methods of treatment have been discussed with the patient and family. After consideration of risks, benefits and other options for treatment, the patient has consented to  Procedure(s) with comments: COLONOSCOPY WITH PROPOFOL (N/A) - 205 ASA 1 as a surgical intervention.  The patient's history has been reviewed, patient examined, no change in status, stable for surgery.  I have reviewed the patient's chart and labs.  Questions were answered to the patient's satisfaction.     [name] May[place]a

## 2022-07-25 ENCOUNTER — Encounter (INDEPENDENT_AMBULATORY_CARE_PROVIDER_SITE_OTHER): Payer: Self-pay | Admitting: *Deleted

## 2022-07-26 ENCOUNTER — Encounter (HOSPITAL_COMMUNITY): Payer: Self-pay | Admitting: Gastroenterology

## 2023-10-21 ENCOUNTER — Other Ambulatory Visit: Payer: Self-pay

## 2023-10-21 ENCOUNTER — Emergency Department (HOSPITAL_COMMUNITY)
Admission: EM | Admit: 2023-10-21 | Discharge: 2023-10-21 | Disposition: A | Discharge status: Home / Self Care | Payer: Medicaid Other | Attending: Emergency Medicine | Admitting: Emergency Medicine

## 2023-10-21 DIAGNOSIS — F17200 Nicotine dependence, unspecified, uncomplicated: Secondary | ICD-10-CM | POA: Insufficient documentation

## 2023-10-21 DIAGNOSIS — L03211 Cellulitis of face: Secondary | ICD-10-CM | POA: Insufficient documentation

## 2023-10-21 MED ORDER — DOXYCYCLINE HYCLATE 100 MG PO CAPS: 100.0000 mg | ORAL_CAPSULE | Freq: Two times a day (BID) | ORAL | 0 refills | Status: DC

## 2023-10-21 NOTE — ED Provider Notes (Signed)
[place] EMERGENCY DEPARTMENT AT [facility] Provider Note   CSN: [id number] Arrival date & time: 10/21/23  4098     History  Chief Complaint  Patient presents with   Facial Swelling    Angela Fisher is a 42 y.o. female.  Patient was swelling to the right cheek area.  Patient had a blemish there and it came to ahead is a whitehead.  Some pus came out.  But now she is got increased swelling around the cyst.  She did squeeze it some to try to get the pus out.  But things got worse.  Apparently been present there for about 4 days but kind of got worse overnight.  Past medical history significant for psoriasis reflux disease.  Patient is an everyday smoker.  Patient denies fevers.  Or chills.  No headache no neck stiffness.       Home Medications Prior to Admission medications   Medication Sig Start Date End Date Taking? Authorizing Provider  doxycycline (VIBRAMYCIN) 100 MG capsule Take 1 capsule (100 mg total) by mouth 2 (two) times daily. 10/21/23  Yes Vanetta Mulders, MD  Bacitracin-Polymyxin B (NEOSPORIN EX) Apply 1 Application topically 2 (two) times daily as needed (skin irritation.).    [provider]  Biotin w/ Vitamins C & E (HAIR/SKIN/NAILS PO) Take 1 tablet by mouth daily.    [provider]  Cholecalciferol (VITAMIN D3) 250 MCG (10000 UT) TABS Take 10,000 Units by mouth daily.    [provider]  diphenhydramine-acetaminophen (TYLENOL PM) 25-500 MG TABS tablet Take 2-3 tablets by mouth at bedtime.    [provider]  Ginger, Zingiber officinalis, (GINGER ROOT) 550 MG CAPS Take 550 mg by mouth daily.    [provider]  Misc Natural Products (COLON CLEANSER PO) Take 1 capsule by mouth once a week.    [provider]  mupirocin ointment (BACTROBAN) 2 % Apply 1 Application topically daily as needed (skin irritation). 05/06/22   [provider]  polyethylene glycol-electrolytes (TRILYTE) 420 g  solution Take 4,000 mLs by mouth as directed. 06/30/22   Dolores Frame, MD      Allergies    Patient has no known allergies.    Review of Systems   Review of Systems  Constitutional:  Negative for chills and fever.  HENT:  Positive for facial swelling. Negative for ear pain and sore throat.   Eyes:  Negative for pain and visual disturbance.  Respiratory:  Negative for cough and shortness of breath.   Cardiovascular:  Negative for chest pain and palpitations.  Gastrointestinal:  Negative for abdominal pain and vomiting.  Genitourinary:  Negative for dysuria and hematuria.  Musculoskeletal:  Negative for arthralgias and back pain.  Skin:  Negative for color change and rash.  Neurological:  Negative for seizures and syncope.  All other systems reviewed and are negative.   Physical Exam Updated Vital Signs BP 125/82 (BP Location: Right Arm)   Pulse 81   Temp 98 F (36.7 C) (Oral)   Resp 14   Ht 1.702 m (5\' 7" )   Wt 75.3 kg   SpO2 98%   BMI 26.00 kg/m  Physical Exam Vitals and nursing note reviewed.  Constitutional:      General: She is not in acute distress.    Appearance: Normal appearance. She is well-developed.  HENT:     Head: Normocephalic.     Comments: Blemish right cheek area.  With some area of induration and slight  erythema measuring about 4 cm.  The cyst itself is very small.  This is mostly cellulitis. Eyes:     Extraocular Movements: Extraocular movements intact.     Conjunctiva/sclera: Conjunctivae normal.     Pupils: Pupils are equal, round, and reactive to light.  Cardiovascular:     Rate and Rhythm: Normal rate and regular rhythm.     Heart sounds: No murmur heard. Pulmonary:     Effort: Pulmonary effort is normal. No respiratory distress.     Breath sounds: Normal breath sounds.  Abdominal:     Palpations: Abdomen is soft.     Tenderness: There is no abdominal tenderness.  Musculoskeletal:        General: No swelling.     Cervical back:  Neck supple.  Skin:    General: Skin is warm and dry.     Capillary Refill: Capillary refill takes less than 2 seconds.  Neurological:     General: No focal deficit present.     Mental Status: She is alert and oriented to person, place, and time.  Psychiatric:        Mood and Affect: Mood normal.     ED Results / Procedures / Treatments   Labs (all labs ordered are listed, but only abnormal results are displayed) Labs Reviewed - No data to display  EKG None  Radiology No results found.  Procedures Procedures    Medications Ordered in ED Medications - No data to display  ED Course/ Medical Decision Making/ A&P                                 Medical Decision Making Risk Prescription drug management.   Patient's had an abdominal hysterectomy.  Patient has no allergies to antibiotics.  Patient with facial cellulitis secondary to a small cyst abscess of the right cheek area.  Will treat with doxycycline.  Recommend warm compresses.  Suspect that the small cyst will drain some spontaneously.  The antibiotics are to clear up the cellulitis.  Incision and drainage not required at this time and not recommended.   Final Clinical Impression(s) / ED Diagnoses Final diagnoses:  Facial cellulitis    Rx / DC Orders ED Discharge Orders          Ordered    doxycycline (VIBRAMYCIN) 100 MG capsule  2 times daily        10/21/23 0907              Vanetta Mulders, MD 10/21/23 [phone]

## 2023-10-21 NOTE — Discharge Instructions (Addendum)
Take the antibiotic doxycycline as directed for the next 7 days.  Put warm compresses on the right cheek area where the cyst is.  The antibiotic should clear things up over the next few days.  If the cyst drained spontaneously that is fine.  Return for any new or worse symptoms.

## 2023-10-21 NOTE — ED Triage Notes (Signed)
Pt complains of abscess on right check x4 days. Pt states she removed puss from the area yesterday and now face is swollen.

## 2023-11-10 ENCOUNTER — Encounter (HOSPITAL_COMMUNITY): Payer: Self-pay

## 2023-11-10 ENCOUNTER — Emergency Department (HOSPITAL_COMMUNITY): Payer: Medicaid Other

## 2023-11-10 ENCOUNTER — Other Ambulatory Visit: Payer: Self-pay

## 2023-11-10 ENCOUNTER — Emergency Department (HOSPITAL_COMMUNITY)
Admission: EM | Admit: 2023-11-10 | Discharge: 2023-11-10 | Disposition: A | Discharge status: Home / Self Care | Payer: Medicaid Other | Attending: Emergency Medicine | Admitting: Emergency Medicine

## 2023-11-10 DIAGNOSIS — J449 Chronic obstructive pulmonary disease, unspecified: Secondary | ICD-10-CM | POA: Insufficient documentation

## 2023-11-10 DIAGNOSIS — J45901 Unspecified asthma with (acute) exacerbation: Secondary | ICD-10-CM | POA: Insufficient documentation

## 2023-11-10 DIAGNOSIS — J4541 Moderate persistent asthma with (acute) exacerbation: Secondary | ICD-10-CM

## 2023-11-10 DIAGNOSIS — R0602 Shortness of breath: Secondary | ICD-10-CM

## 2023-11-10 DIAGNOSIS — F17210 Nicotine dependence, cigarettes, uncomplicated: Secondary | ICD-10-CM | POA: Diagnosis not present

## 2023-11-10 LAB — CBC WITH DIFFERENTIAL/PLATELET
Abs Immature Granulocytes: 0.01 10*3/uL (ref 0.00–0.07)
Basophils Absolute: 0 10*3/uL (ref 0.0–0.1)
Basophils Relative: 1 %
Eosinophils Absolute: 0.1 10*3/uL (ref 0.0–0.5)
Eosinophils Relative: 2 %
HCT: 44.5 % (ref 36.0–46.0)
Hemoglobin: 15.2 g/dL — ABNORMAL HIGH (ref 12.0–15.0)
Immature Granulocytes: 0 %
Lymphocytes Relative: 37 %
Lymphs Abs: 2.1 10*3/uL (ref 0.7–4.0)
MCH: 30.7 pg (ref 26.0–34.0)
MCHC: 34.2 g/dL (ref 30.0–36.0)
MCV: 89.9 fL (ref 80.0–100.0)
Monocytes Absolute: 0.4 10*3/uL (ref 0.1–1.0)
Monocytes Relative: 7 %
Neutro Abs: 3.1 10*3/uL (ref 1.7–7.7)
Neutrophils Relative %: 53 %
Platelets: 136 10*3/uL — ABNORMAL LOW (ref 150–400)
RBC: 4.95 MIL/uL (ref 3.87–5.11)
RDW: 12.1 % (ref 11.5–15.5)
WBC: 5.7 10*3/uL (ref 4.0–10.5)
nRBC: 0 % (ref 0.0–0.2)

## 2023-11-10 LAB — BASIC METABOLIC PANEL
Anion gap: 9 (ref 5–15)
BUN: 5 mg/dL — ABNORMAL LOW (ref 6–20)
CO2: 23 mmol/L (ref 22–32)
Calcium: 9 mg/dL (ref 8.9–10.3)
Chloride: 107 mmol/L (ref 98–111)
Creatinine, Ser: 0.78 mg/dL (ref 0.44–1.00)
GFR, Estimated: >60 mL/min (ref >60)
Glucose, Bld: 92 mg/dL (ref 70–99)
Potassium: 4 mmol/L (ref 3.5–5.1)
Sodium: 139 mmol/L (ref 135–145)

## 2023-11-10 MED ORDER — METHYLPREDNISOLONE SODIUM SUCC 125 MG IJ SOLR
125.0000 mg | Freq: Once | INTRAMUSCULAR | Status: AC
Start: 2023-11-10 — End: 2023-11-10
  Administered 2023-11-10: 125 mg via INTRAVENOUS
  Filled 2023-11-10: qty 2

## 2023-11-10 MED ORDER — PREDNISONE 10 MG PO TABS: 40.0000 mg | ORAL_TABLET | Freq: Every day | ORAL | 0 refills | Status: DC

## 2023-11-10 MED ORDER — IPRATROPIUM-ALBUTEROL 0.5-2.5 (3) MG/3ML IN SOLN
3.0000 mL | Freq: Once | RESPIRATORY_TRACT | Status: AC
  Administered 2023-11-10: 3 mL via RESPIRATORY_TRACT
  Filled 2023-11-10: qty 3

## 2023-11-10 MED ORDER — VENTOLIN HFA 108 (90 BASE) MCG/ACT IN AERS: 2.0000 | INHALATION_SPRAY | Freq: Four times a day (QID) | RESPIRATORY_TRACT | 1 refills | Status: DC | PRN

## 2023-11-10 NOTE — ED Provider Notes (Addendum)
[place] EMERGENCY DEPARTMENT AT [facility] Provider Note   CSN: [id number] Arrival date & time: 11/10/23  1048     History  Chief Complaint  Patient presents with   Shortness of Breath    Angela Fisher is a 42 y.o. female.  Patient as she states that she been having some wheezing problems since early November but is gotten worse here in the last few days.  Patient is a smoker she has been trying to cut back.  I saw patient on November 9 for a facial skin cyst treated with doxycycline that has healed.  Past medical history significant for gastroesophageal reflux disease.  Patient actually borrowed a friend's albuterol inhaler that she had been using with minimal help.  Past surgical history significant for abdominal hysterectomy.  Patient is an everyday user of cigarettes and e-cigarettes.  Patient has had a hysterectomy.       Home Medications Prior to Admission medications   Medication Sig Start Date End Date Taking? Authorizing Provider  Bacitracin-Polymyxin B (NEOSPORIN EX) Apply 1 Application topically 2 (two) times daily as needed (skin irritation.).    [provider]  Biotin w/ Vitamins C & E (HAIR/SKIN/NAILS PO) Take 1 tablet by mouth daily.    [provider]  Cholecalciferol (VITAMIN D3) 250 MCG (10000 UT) TABS Take 10,000 Units by mouth daily.    [provider]  diphenhydramine-acetaminophen (TYLENOL PM) 25-500 MG TABS tablet Take 2-3 tablets by mouth at bedtime.    [provider]  doxycycline (VIBRAMYCIN) 100 MG capsule Take 1 capsule (100 mg total) by mouth 2 (two) times daily. 10/21/23   Vanetta Mulders, MD  Ginger, Zingiber officinalis, (GINGER ROOT) 550 MG CAPS Take 550 mg by mouth daily.    [provider]  Misc Natural Products (COLON CLEANSER PO) Take 1 capsule by mouth once a week.    [provider]  mupirocin ointment (BACTROBAN) 2 % Apply 1 Application topically daily as needed (skin  irritation). 05/06/22   [provider]      Allergies    Patient has no known allergies.    Review of Systems   Review of Systems  Constitutional:  Negative for chills and fever.  HENT:  Negative for ear pain and sore throat.   Eyes:  Negative for pain and visual disturbance.  Respiratory:  Positive for shortness of breath and wheezing. Negative for cough.   Cardiovascular:  Negative for chest pain and palpitations.  Gastrointestinal:  Negative for abdominal pain and vomiting.  Genitourinary:  Negative for dysuria and hematuria.  Musculoskeletal:  Negative for arthralgias and back pain.  Skin:  Negative for color change and rash.  Neurological:  Negative for seizures and syncope.  All other systems reviewed and are negative.   Physical Exam Updated Vital Signs BP (!) 136/95   Pulse 67   Temp 98.3 F (36.8 C) (Oral)   Resp 16   SpO2 93%  Physical Exam Vitals and nursing note reviewed.  Constitutional:      General: She is not in acute distress.    Appearance: Normal appearance. She is well-developed.  HENT:     Head: Normocephalic and atraumatic.  Eyes:     Extraocular Movements: Extraocular movements intact.     Conjunctiva/sclera: Conjunctivae normal.     Pupils: Pupils are equal, round, and reactive to light.  Cardiovascular:     Rate and Rhythm: Normal rate and regular rhythm.     Heart sounds: No  murmur heard. Pulmonary:     Effort: Pulmonary effort is normal. No respiratory distress.     Breath sounds: No stridor. Wheezing present.  Abdominal:     Palpations: Abdomen is soft.     Tenderness: There is no abdominal tenderness.  Musculoskeletal:        General: No swelling.     Cervical back: Neck supple. No rigidity.  Skin:    General: Skin is warm and dry.     Capillary Refill: Capillary refill takes less than 2 seconds.  Neurological:     General: No focal deficit present.     Mental Status: She is alert and oriented to person, place, and time.   Psychiatric:        Mood and Affect: Mood normal.     ED Results / Procedures / Treatments   Labs (all labs ordered are listed, but only abnormal results are displayed) Labs Reviewed  CBC WITH DIFFERENTIAL/PLATELET  BASIC METABOLIC PANEL    EKG EKG Interpretation Date/Time:  Friday November 10 2023 11:14:18 EST Ventricular Rate:  65 PR Interval:  166 QRS Duration:  97 QT Interval:  470 QTC Calculation: 485 R Axis:   83  Text Interpretation: Sinus rhythm Anteroseptal infarct, old No previous ECGs available Confirmed by Vanetta Mulders [phone]) on 11/10/2023 11:55:16 AM  Radiology No results found.  Procedures Procedures    Medications Ordered in ED Medications  ipratropium-albuterol (DUONEB) 0.5-2.5 (3) MG/3ML nebulizer solution 3 mL (has no administration in time range)  methylPREDNISolone sodium succinate (SOLU-MEDROL) 125 mg/2 mL injection 125 mg (has no administration in time range)    ED Course/ Medical Decision Making/ A&P                                 Medical Decision Making Amount and/or Complexity of Data Reviewed Labs: ordered. Radiology: ordered.  Risk Prescription drug management.   Patient most likely exhibiting signs of COPD.  But never formally diagnosed.  Patient with wheezing bilaterally and throughout.  Oxygen sat is pretty good at 95%.  Patient's blood pressure a little marginal at 95/65.  Will observe that.  Will get chest x-ray given DuoNeb and IV Solu-Medrol  Patient may remain not clear.  She does not clear she will require admission.  Patient wheezing is almost completely resolved.   Responded well to the [facility] treatment.  Blood pressure improved to 136 systolic.  CBC still pending but electrolytes look normal.  Will go ahead and discharge with albuterol inhaler and continuing prednisone for the next 5 days and follow-up with her primary care doctor.  Symptoms could all be early COPD.   Final Clinical Impression(s) / ED  Diagnoses Final diagnoses:  Shortness of breath  Moderate persistent reactive airway disease with acute exacerbation    Rx / DC Orders ED Discharge Orders     None         Vanetta Mulders, MD 11/10/23 1215    Vanetta Mulders, MD 11/10/23 1523

## 2023-11-10 NOTE — Discharge Instructions (Signed)
Take the prednisone as directed for the next 5 days.  Use your albuterol inhaler 2 puffs every 6 hours at least for the next 7 days then as needed.  Make an appointment to follow-up with your doctor.  As we discussed this could be early symptoms of COPD.  Return for any new or worse symptoms.

## 2023-11-10 NOTE — ED Triage Notes (Signed)
Pt states she went to aunt's house this am, becaame SOB, sweaty, so she came on to get checked out.

## 2024-02-02 ENCOUNTER — Emergency Department (HOSPITAL_COMMUNITY)
Admission: EM | Admit: 2024-02-02 | Discharge: 2024-02-02 | Disposition: A | Discharge status: Home / Self Care | Payer: Medicaid Other | Attending: Emergency Medicine | Admitting: Emergency Medicine

## 2024-02-02 ENCOUNTER — Other Ambulatory Visit: Payer: Self-pay

## 2024-02-02 DIAGNOSIS — R112 Nausea with vomiting, unspecified: Secondary | ICD-10-CM | POA: Diagnosis not present

## 2024-02-02 DIAGNOSIS — R0981 Nasal congestion: Secondary | ICD-10-CM | POA: Diagnosis present

## 2024-02-02 DIAGNOSIS — J101 Influenza due to other identified influenza virus with other respiratory manifestations: Secondary | ICD-10-CM | POA: Diagnosis not present

## 2024-02-02 LAB — URINALYSIS, ROUTINE W REFLEX MICROSCOPIC
Bacteria, UA: NONE SEEN
Bilirubin Urine: NEGATIVE
Glucose, UA: NEGATIVE mg/dL
Ketones, ur: 80 mg/dL — AB
Leukocytes,Ua: NEGATIVE
Nitrite: NEGATIVE
Protein, ur: 30 mg/dL — AB
Specific Gravity, Urine: 1.025 (ref 1.005–1.030)
pH: 5 (ref 5.0–8.0)

## 2024-02-02 LAB — COMPREHENSIVE METABOLIC PANEL
ALT: 50 U/L — ABNORMAL HIGH (ref 0–44)
AST: 39 U/L (ref 15–41)
Albumin: 4.3 g/dL (ref 3.5–5.0)
Alkaline Phosphatase: 64 U/L (ref 38–126)
Anion gap: 14 (ref 5–15)
BUN: 9 mg/dL (ref 6–20)
CO2: 25 mmol/L (ref 22–32)
Calcium: 9.1 mg/dL (ref 8.9–10.3)
Chloride: 98 mmol/L (ref 98–111)
Creatinine, Ser: 0.77 mg/dL (ref 0.44–1.00)
GFR, Estimated: >60 mL/min (ref >60)
Glucose, Bld: 95 mg/dL (ref 70–99)
Potassium: 3.5 mmol/L (ref 3.5–5.1)
Sodium: 137 mmol/L (ref 135–145)
Total Bilirubin: 0.6 mg/dL (ref 0.0–1.2)
Total Protein: 7.4 g/dL (ref 6.5–8.1)

## 2024-02-02 LAB — RESP PANEL BY RT-PCR (RSV, FLU A&B, COVID)  RVPGX2
Influenza A by PCR: POSITIVE — AB
Influenza B by PCR: NEGATIVE
Resp Syncytial Virus by PCR: NEGATIVE
SARS Coronavirus 2 by RT PCR: NEGATIVE

## 2024-02-02 LAB — CBC
HCT: 49.6 % — ABNORMAL HIGH (ref 36.0–46.0)
Hemoglobin: 16.8 g/dL — ABNORMAL HIGH (ref 12.0–15.0)
MCH: 29.9 pg (ref 26.0–34.0)
MCHC: 33.9 g/dL (ref 30.0–36.0)
MCV: 88.4 fL (ref 80.0–100.0)
Platelets: 146 10*3/uL — ABNORMAL LOW (ref 150–400)
RBC: 5.61 MIL/uL — ABNORMAL HIGH (ref 3.87–5.11)
RDW: 12.2 % (ref 11.5–15.5)
WBC: 3.4 10*3/uL — ABNORMAL LOW (ref 4.0–10.5)
nRBC: 0 % (ref 0.0–0.2)

## 2024-02-02 LAB — LIPASE, BLOOD: Lipase: 27 U/L (ref 11–51)

## 2024-02-02 MED ORDER — ONDANSETRON 4 MG PO TBDP: 4.0000 mg | ORAL_TABLET | Freq: Three times a day (TID) | ORAL | 0 refills | Status: DC | PRN

## 2024-02-02 MED ORDER — ONDANSETRON 4 MG PO TBDP
4.0000 mg | ORAL_TABLET | Freq: Once | ORAL | Status: AC | PRN
  Administered 2024-02-02: 4 mg via ORAL
  Filled 2024-02-02: qty 1

## 2024-02-02 NOTE — ED Provider Notes (Signed)
 [place] EMERGENCY DEPARTMENT AT [facility] Provider Note   CSN: [id number] Arrival date & time: 02/02/24  1521     History  Chief Complaint  Patient presents with   Nausea    Angela Fisher is a 43 y.o. female.  Patient with nausea and vomiting for the past week. She reports nasal congestion without significant cough and no fever. No sick contacts.   The history is provided by the patient. No language interpreter was used.       Home Medications Prior to Admission medications   Medication Sig Start Date End Date Taking? Authorizing Provider  ondansetron (ZOFRAN-ODT) 4 MG disintegrating tablet Take 1 tablet (4 mg total) by mouth every 8 (eight) hours as needed for nausea or vomiting. 02/02/24  Yes Jerilyn Gillaspie, PA-C  albuterol (VENTOLIN HFA) 108 (90 Base) MCG/ACT inhaler Inhale 2 puffs into the lungs every 6 (six) hours as needed for wheezing or shortness of breath. 11/10/23   Vanetta Mulders, MD  Bacitracin-Polymyxin B (NEOSPORIN EX) Apply 1 Application topically 2 (two) times daily as needed (skin irritation.).    [provider]  Biotin w/ Vitamins C & E (HAIR/SKIN/NAILS PO) Take 1 tablet by mouth daily.    [provider]  Cholecalciferol (VITAMIN D3) 250 MCG (10000 UT) TABS Take 10,000 Units by mouth daily.    [provider]  diphenhydramine-acetaminophen (TYLENOL PM) 25-500 MG TABS tablet Take 2-3 tablets by mouth at bedtime.    [provider]  doxycycline (VIBRAMYCIN) 100 MG capsule Take 1 capsule (100 mg total) by mouth 2 (two) times daily. 10/21/23   Vanetta Mulders, MD  Ginger, Zingiber officinalis, (GINGER ROOT) 550 MG CAPS Take 550 mg by mouth daily.    [provider]  Misc Natural Products (COLON CLEANSER PO) Take 1 capsule by mouth once a week.    [provider]  mupirocin ointment (BACTROBAN) 2 % Apply 1 Application topically daily as needed (skin irritation). 05/06/22   [provider]  predniSONE (DELTASONE) 10 MG tablet Take 4 tablets (40 mg total) by mouth daily. 11/10/23   Vanetta Mulders, MD      Allergies    Patient has no known allergies.    Review of Systems   Review of Systems  Physical Exam Updated Vital Signs BP 135/80   Pulse 100   Temp 98.9 F (37.2 C) (Oral)   Resp 18   SpO2 97%  Physical Exam Constitutional:      Appearance: She is well-developed.  HENT:     Head: Normocephalic.  Cardiovascular:     Rate and Rhythm: Normal rate and regular rhythm.     Heart sounds: No murmur heard. Pulmonary:     Effort: Pulmonary effort is normal.     Breath sounds: Normal breath sounds. No wheezing, rhonchi or rales.  Abdominal:     Palpations: Abdomen is soft.     Tenderness: There is no abdominal tenderness. There is no guarding or rebound.  Musculoskeletal:        General: Normal range of motion.     Cervical back: Normal range of motion and neck supple.  Skin:    General: Skin is warm and dry.  Neurological:     General: No focal deficit present.     Mental Status: She is alert and oriented to person, place, and time.     ED Results / Procedures / Treatments   Labs (all labs ordered are listed, but only abnormal results  are displayed) Labs Reviewed  RESP PANEL BY RT-PCR (RSV, FLU A&B, COVID)  RVPGX2 - Abnormal; Notable for the following components:      Result Value   Influenza A by PCR POSITIVE (*)    All other components within normal limits  COMPREHENSIVE METABOLIC PANEL - Abnormal; Notable for the following components:   ALT 50 (*)    All other components within normal limits  CBC - Abnormal; Notable for the following components:   WBC 3.4 (*)    RBC 5.61 (*)    Hemoglobin 16.8 (*)    HCT 49.6 (*)    Platelets 146 (*)    All other components within normal limits  URINALYSIS, ROUTINE W REFLEX MICROSCOPIC - Abnormal; Notable for the following components:   APPearance HAZY (*)    Hgb urine dipstick MODERATE (*)    Ketones, ur  80 (*)    Protein, ur 30 (*)    All other components within normal limits  LIPASE, BLOOD    EKG None  Radiology No results found.  Procedures Procedures    Medications Ordered in ED Medications  ondansetron (ZOFRAN-ODT) disintegrating tablet 4 mg (4 mg Oral Given 02/02/24 2206)    ED Course/ Medical Decision Making/ A&P Clinical Course as of 02/03/24 2248  Fri Feb 02, 2024  2147 Patient with several days of vomiting and diarrhea. Well appearing. No vomiting in eD. Influenza A+. Will treat supportively.  [SU]    Clinical Course User Index [SU] Elpidio Anis, PA-C                                 Medical Decision Making Amount and/or Complexity of Data Reviewed Labs: ordered.  Risk Prescription drug management.           Final Clinical Impression(s) / ED Diagnoses Final diagnoses:  Influenza A  Nausea and vomiting, unspecified vomiting type    Rx / DC Orders ED Discharge Orders          Ordered    ondansetron (ZOFRAN-ODT) 4 MG disintegrating tablet  Every 8 hours PRN        02/02/24 2131              Elpidio Anis, PA-C 02/03/24 2248    Bethann Berkshire, MD 02/04/24 1041

## 2024-02-02 NOTE — ED Notes (Signed)
 FLOAT NURSE

## 2024-02-02 NOTE — ED Triage Notes (Signed)
Pt reports nausea and vomiting x 1 week. Pt denies fevers or chills.

## 2024-02-02 NOTE — Discharge Instructions (Signed)
It is important to drink lots of fluids. Take Zofran to control nausea, and advance your diet to include food when you can eat without vomiting.   Tylenol and/or ibuprofen for aches and any fever.

## 2024-04-06 ENCOUNTER — Encounter (HOSPITAL_COMMUNITY): Payer: Self-pay | Admitting: Emergency Medicine

## 2024-04-06 ENCOUNTER — Emergency Department (HOSPITAL_COMMUNITY)
Admission: EM | Admit: 2024-04-06 | Discharge: 2024-04-06 | Disposition: A | Discharge status: Home / Self Care | Attending: Emergency Medicine | Admitting: Emergency Medicine

## 2024-04-06 ENCOUNTER — Other Ambulatory Visit: Payer: Self-pay

## 2024-04-06 DIAGNOSIS — R21 Rash and other nonspecific skin eruption: Secondary | ICD-10-CM | POA: Diagnosis present

## 2024-04-06 MED ORDER — PREDNISONE 50 MG PO TABS: ORAL_TABLET | ORAL | 0 refills | Status: DC

## 2024-04-06 MED ORDER — PREDNISONE 50 MG PO TABS
60.0000 mg | ORAL_TABLET | Freq: Once | ORAL | Status: AC
  Administered 2024-04-06: 60 mg via ORAL
  Filled 2024-04-06: qty 1

## 2024-04-06 NOTE — Discharge Instructions (Addendum)
 You can take prednisone  every day for 4 days You could also take Benadryl over-the-counter as needed for itching

## 2024-04-06 NOTE — ED Provider Notes (Signed)
 [place] EMERGENCY DEPARTMENT AT [facility] Provider Note   CSN: [id number] Arrival date & time: 04/06/24  0122     History  Chief Complaint  Patient presents with   Allergic Reaction    Angela Fisher is a 43 y.o. female.  The history is provided by the patient.  Rash Location:  Face Facial rash location:  Face Severity:  Moderate Onset quality:  Gradual Duration:  4 weeks Timing:  Intermittent Chronicity:  New Worsened by:  Nothing Associated symptoms: no shortness of breath and not vomiting   Pt reports rash for 4 weeks It is on her face and arms She is unsure what caused this rash No sob, no vomiting No other complaints     Home Medications Prior to Admission medications   Medication Sig Start Date End Date Taking? Authorizing Provider  predniSONE  (DELTASONE ) 50 MG tablet 1 tablet PO QD X4 days 04/06/24  Yes Eldon Greenland, MD  Bacitracin-Polymyxin B (NEOSPORIN EX) Apply 1 Application topically 2 (two) times daily as needed (skin irritation.).    [provider]  Biotin w/ Vitamins C & E (HAIR/SKIN/NAILS PO) Take 1 tablet by mouth daily.    [provider]  Cholecalciferol (VITAMIN D3) 250 MCG (10000 UT) TABS Take 10,000 Units by mouth daily.    [provider]  diphenhydramine-acetaminophen (TYLENOL PM) 25-500 MG TABS tablet Take 2-3 tablets by mouth at bedtime.    [provider]  Ginger, Zingiber officinalis, (GINGER ROOT) 550 MG CAPS Take 550 mg by mouth daily.    [provider]  Misc Natural Products (COLON CLEANSER PO) Take 1 capsule by mouth once a week.    [provider]  mupirocin ointment (BACTROBAN) 2 % Apply 1 Application topically daily as needed (skin irritation). 05/06/22   [provider]      Allergies    Patient has no known allergies.    Review of Systems   Review of Systems  Respiratory:  Negative for shortness of breath.   Gastrointestinal:  Negative for  vomiting.  Skin:  Positive for rash.    Physical Exam Updated Vital Signs BP 117/84 (BP Location: Left Arm)   Pulse 77   Temp 98.1 F (36.7 C) (Oral)   Resp 18   Ht 1.702 m (5\' 7" )   Wt 75.3 kg   SpO2 96%   BMI 26.00 kg/m  Physical Exam CONSTITUTIONAL: Disheveled, no acute distress HEAD: Normocephalic/atraumatic EYES: EOMI/PERRL ENMT: Mucous membranes moist, no angioedema, no stridor, no drooling.  Scattered rash noted to the face, no signs of cellulitis or abscess CV: S1/S2 noted, no murmurs/rubs/gallops noted LUNGS: Lungs are clear to auscultation bilaterally, no apparent distress NEURO: Pt is awake/alert/appropriate, moves all extremitiesx4.  No facial droop.   SKIN: Scattered rash to the face neck and arms No abscess or cellulitis  ED Results / Procedures / Treatments   Labs (all labs ordered are listed, but only abnormal results are displayed) Labs Reviewed - No data to display  EKG None  Radiology No results found.  Procedures Procedures    Medications Ordered in ED Medications  predniSONE  (DELTASONE ) tablet 60 mg (has no administration in time range)    ED Course/ Medical Decision Making/ A&P                                 Medical Decision Making Risk Prescription drug management.   Presents with rash  for 4 weeks.  Unclear etiology.  No signs of anaphylaxis.  Advised Benadryl will give short course of prednisone .  No signs of any cellulitis or abscess.  Patient safe for outpatient management        Final Clinical Impression(s) / ED Diagnoses Final diagnoses:  Rash    Rx / DC Orders ED Discharge Orders          Ordered    predniSONE  (DELTASONE ) 50 MG tablet        04/06/24 0143              Eldon Greenland, MD 04/06/24 0147

## 2024-04-06 NOTE — ED Triage Notes (Signed)
 Pt with c/o rash to face and body for 4 weeks. States it is itchy.

## 2024-07-24 ENCOUNTER — Other Ambulatory Visit: Payer: Self-pay

## 2024-07-24 ENCOUNTER — Emergency Department (HOSPITAL_COMMUNITY)
Admission: EM | Admit: 2024-07-24 | Discharge: 2024-07-24 | Disposition: A | Discharge status: Home / Self Care | Attending: Emergency Medicine | Admitting: Emergency Medicine

## 2024-07-24 ENCOUNTER — Encounter (HOSPITAL_COMMUNITY): Payer: Self-pay

## 2024-07-24 DIAGNOSIS — L089 Local infection of the skin and subcutaneous tissue, unspecified: Secondary | ICD-10-CM | POA: Diagnosis present

## 2024-07-24 MED ORDER — CEPHALEXIN 500 MG PO CAPS: 500.0000 mg | ORAL_CAPSULE | Freq: Four times a day (QID) | ORAL | 0 refills | Status: DC

## 2024-07-24 MED ORDER — MUPIROCIN 2 % EX OINT: 1.0000 | TOPICAL_OINTMENT | Freq: Three times a day (TID) | CUTANEOUS | 0 refills | Status: DC

## 2024-07-24 NOTE — ED Provider Notes (Signed)
 [place] EMERGENCY DEPARTMENT AT [facility] Provider Note   CSN: [id number] Arrival date & time: 07/24/24  1157     Patient presents with: Abscess   Angela Fisher is a 43 y.o. female.    Abscess Associated symptoms: no fever, no headaches, no nausea and no vomiting        Angela Fisher is a 43 y.o. female who presents to the Emergency Department complaining of area of pain and swelling above her right ear.  Symptoms been present for 3 to 4 days.  She colored her hair recently and noticed a small pimple she has been applying pimple patches to the area without improvement.  Denies any bleeding or drainage, neck pain, headache, dizziness fever or chills.    Prior to Admission medications   Medication Sig Start Date End Date Taking? Authorizing Provider  Bacitracin-Polymyxin B (NEOSPORIN EX) Apply 1 Application topically 2 (two) times daily as needed (skin irritation.).    [provider]  Biotin w/ Vitamins C & E (HAIR/SKIN/NAILS PO) Take 1 tablet by mouth daily.    [provider]  Cholecalciferol (VITAMIN D3) 250 MCG (10000 UT) TABS Take 10,000 Units by mouth daily.    [provider]  diphenhydramine-acetaminophen (TYLENOL PM) 25-500 MG TABS tablet Take 2-3 tablets by mouth at bedtime.    [provider]  Ginger, Zingiber officinalis, (GINGER ROOT) 550 MG CAPS Take 550 mg by mouth daily.    [provider]  Misc Natural Products (COLON CLEANSER PO) Take 1 capsule by mouth once a week.    [provider]  mupirocin  ointment (BACTROBAN ) 2 % Apply 1 Application topically daily as needed (skin irritation). 05/06/22   [provider]  predniSONE  (DELTASONE ) 50 MG tablet 1 tablet PO QD X4 days 04/06/24   Midge Golas, MD    Allergies: Patient has no known allergies.    Review of Systems  Constitutional:  Negative for appetite change, chills and fever.  Eyes:  Negative for visual disturbance.   Respiratory:  Negative for shortness of breath.   Cardiovascular:  Negative for chest pain.  Gastrointestinal:  Negative for abdominal pain, nausea and vomiting.  Musculoskeletal:  Negative for neck pain and neck stiffness.  Skin:        Pimple to right scalp  Neurological:  Negative for dizziness and headaches.    Updated Vital Signs BP 119/81 (BP Location: Right Arm)   Pulse 74   Temp 98 F (36.7 C)   Resp 16   Ht 5' 7 (1.702 m)   Wt 68 kg   SpO2 96%   BMI 23.49 kg/m   Physical Exam Vitals and nursing note reviewed.  Constitutional:      General: She is not in acute distress.    Appearance: Normal appearance. She is not toxic-appearing.  Cardiovascular:     Rate and Rhythm: Normal rate and regular rhythm.     Pulses: Normal pulses.  Pulmonary:     Effort: Pulmonary effort is normal.  Musculoskeletal:        General: Normal range of motion.     Cervical back: Normal range of motion. No spinous process tenderness or muscular tenderness.  Lymphadenopathy:     Cervical: No cervical adenopathy.     Right cervical: No deep or posterior cervical adenopathy. Skin:    General: Skin is warm.     Comments: 0.5 cm papule to right scalp just above helix of the right ear.  Mild erythema.  No purulent drainage or fluctuance noted.  Neurological:     General: No focal deficit present.     Mental Status: She is alert.     Sensory: No sensory deficit.     Motor: No weakness.     (all labs ordered are listed, but only abnormal results are displayed) Labs Reviewed - No data to display  EKG: None  Radiology: No results found.   Procedures   Medications Ordered in the ED - No data to display                                  Medical Decision Making Patient here for evaluation of papule to right scalp.  Developed 3 to 4 days ago after dying her hair.  She denies any neck pain headache dizziness or visual changes.  No fever or chills.  She has been using OTC pimple  patches without relief.   On exam, patient well-appearing nontoxic.  Vital signs are reassuring.  Small papule without surrounding erythema or fluctuance.  No active drainage.  Suspect pustule versus developing abscess vs enlarged lymph node. No clinical signs of cellulitis  Amount and/or Complexity of Data Reviewed Discussion of management or test interpretation with external provider(s): Doubt emergent process.  Suspect may be developing abscess, I will prescribe oral antibiotics and Bactroban . No indication for I&D at this time  Patient appears appropriate for discharge home, I have recommended close outpatient follow-up with her PCP or ER return if her symptoms worsen.  Risk Prescription drug management.        Final diagnoses:  Skin pustule    ED Discharge Orders     None          Herlinda Milling, PA-C 07/24/24 1313    Francesca Elsie CROME, MD 07/24/24 1323

## 2024-07-24 NOTE — ED Triage Notes (Signed)
 Pt reports she colored her hair 3-4 days ago and is concerned she has a boil on her head above her right ear.  Pt has a small blemish patch over the area and no redness or swelling is visible.  Pt reports her PCP sent her to voicemail so she came here.

## 2024-07-24 NOTE — Discharge Instructions (Signed)
 You may apply warm wet compresses on and off to the affected area.  Take the antibiotic as directed and apply the ointment 3 times a day to the affected area.  Follow-up with your primary care provider for recheck if needed return to the ER for any new or worsening symptoms.

## 2024-09-02 ENCOUNTER — Encounter (HOSPITAL_COMMUNITY): Payer: Self-pay | Admitting: Emergency Medicine

## 2024-09-02 ENCOUNTER — Other Ambulatory Visit: Payer: Self-pay

## 2024-09-02 ENCOUNTER — Emergency Department (HOSPITAL_COMMUNITY)
Admission: EM | Admit: 2024-09-02 | Discharge: 2024-09-02 | Disposition: A | Discharge status: Home / Self Care | Attending: Emergency Medicine | Admitting: Emergency Medicine

## 2024-09-02 DIAGNOSIS — L03211 Cellulitis of face: Secondary | ICD-10-CM | POA: Diagnosis present

## 2024-09-02 DIAGNOSIS — L0390 Cellulitis, unspecified: Secondary | ICD-10-CM

## 2024-09-02 MED ORDER — DOXYCYCLINE HYCLATE 100 MG PO CAPS: 100.0000 mg | ORAL_CAPSULE | Freq: Two times a day (BID) | ORAL | 0 refills | Status: DC

## 2024-09-02 MED ORDER — MUPIROCIN 2 % EX OINT: 1.0000 | TOPICAL_OINTMENT | Freq: Two times a day (BID) | CUTANEOUS | 0 refills | Status: AC

## 2024-09-02 NOTE — ED Provider Notes (Signed)
 [place] EMERGENCY DEPARTMENT AT [facility] Provider Note   CSN: [id number] Arrival date & time: 09/02/24  2046     Patient presents with: No chief complaint on file.   Angela Fisher is a 43 y.o. female.   HPI    This patient is a 43 year old female, she has a history of emergency department visits for a rash a couple of times over the last 5 months, she has been found to have facial cellulitis, small areas of MRSA infections and has been treated with mupirocin .  She has had ongoing rash around her eyes her nose her chin her left forearm and the right anterior shin area, several of these are more tender and red, the 1 on her arm is draining a little bit of pus.  No fevers or chills, she has not been seen by a local family doctor for this, denies any allergies to medications.  Prior to Admission medications   Medication Sig Start Date End Date Taking? Authorizing Provider  doxycycline  (VIBRAMYCIN ) 100 MG capsule Take 1 capsule (100 mg total) by mouth 2 (two) times daily. 09/02/24  Yes Cleotilde Rogue, MD  mupirocin  ointment (BACTROBAN ) 2 % Apply 1 Application topically 2 (two) times daily. 09/02/24  Yes Cleotilde Rogue, MD  Biotin w/ Vitamins C & E (HAIR/SKIN/NAILS PO) Take 1 tablet by mouth daily.    [provider]  Cholecalciferol (VITAMIN D3) 250 MCG (10000 UT) TABS Take 10,000 Units by mouth daily.    [provider]  diphenhydramine-acetaminophen (TYLENOL PM) 25-500 MG TABS tablet Take 2-3 tablets by mouth at bedtime.    [provider]  Ginger, Zingiber officinalis, (GINGER ROOT) 550 MG CAPS Take 550 mg by mouth daily.    [provider]  Misc Natural Products (COLON CLEANSER PO) Take 1 capsule by mouth once a week.    [provider]    Allergies: Patient has no known allergies.    Review of Systems  All other systems reviewed and are negative.   Updated Vital Signs BP 117/88 (BP Location: Right Arm)   Pulse 80    Temp 98.9 F (37.2 C) (Oral)   Resp 17   Ht 1.702 m (5' 7)   Wt 68 kg   SpO2 97%   BMI 23.48 kg/m   Physical Exam Vitals and nursing note reviewed.  Constitutional:      Appearance: She is well-developed. She is not diaphoretic.  HENT:     Head: Normocephalic and atraumatic.  Eyes:     General:        Right eye: No discharge.        Left eye: No discharge.     Conjunctiva/sclera: Conjunctivae normal.  Pulmonary:     Effort: Pulmonary effort is normal. No respiratory distress.  Musculoskeletal:     Right lower leg: No edema.     Left lower leg: No edema.  Skin:    General: Skin is warm and dry.     Findings: Erythema and rash present.     Comments: The patient has a dermatitis type inflammatory rash on the bilateral forearms scattered, there is 1 area of an isolated induration with a draining pustule that is approximately 1 cm in diameter, it is not raised significantly off the skin, there is 1 area of 2 cm of induration and redness to the right anterior shin with no fluctuance.  Neurological:     Mental Status: She is alert.     Coordination: Coordination  normal.     (all labs ordered are listed, but only abnormal results are displayed) Labs Reviewed - No data to display  EKG: None  Radiology: No results found.   Procedures   Medications Ordered in the ED - No data to display                                  Medical Decision Making  This patient is a combination of what appears to be an underlying dermatitis but an overlying bacterial infection in a couple of different places but they are very small.  Will place on doxycycline  and topical mupirocin , can follow-up outpatient, patient agreeable, vitals normal     Final diagnoses:  Cellulitis, unspecified cellulitis site    ED Discharge Orders          Ordered    mupirocin  ointment (BACTROBAN ) 2 %  2 times daily        09/02/24 2202    doxycycline  (VIBRAMYCIN ) 100 MG capsule  2 times daily         09/02/24 2202               Cleotilde Rogue, MD 09/02/24 2203

## 2024-09-02 NOTE — Discharge Instructions (Addendum)
 Your examination is consistent with having a staph infection of the skin.  Please apply the topical antibiotic called mupirocin  as well as the oral medicine called doxycycline  twice a day, until the medication is completed.  You should see your doctor for a recheck within 1 week,

## 2024-09-02 NOTE — ED Triage Notes (Signed)
 Pt reports several bumps on body, two on face, one on leg, one on arm.  Pt reports they hurt.

## 2025-01-09 ENCOUNTER — Other Ambulatory Visit: Payer: Self-pay

## 2025-01-09 ENCOUNTER — Emergency Department (HOSPITAL_COMMUNITY): Admission: EM | Admit: 2025-01-09 | Discharge: 2025-01-09 | Disposition: A | Discharge status: Home / Self Care

## 2025-01-09 ENCOUNTER — Encounter (HOSPITAL_COMMUNITY): Payer: Self-pay

## 2025-01-09 DIAGNOSIS — L03211 Cellulitis of face: Secondary | ICD-10-CM

## 2025-01-09 DIAGNOSIS — R220 Localized swelling, mass and lump, head: Secondary | ICD-10-CM | POA: Diagnosis present

## 2025-01-09 HISTORY — DX: Chronic obstructive pulmonary disease, unspecified: J44.9

## 2025-01-09 HISTORY — DX: Unspecified asthma, uncomplicated: J45.909

## 2025-01-09 MED ORDER — DOXYCYCLINE HYCLATE 100 MG PO TABS
100.0000 mg | ORAL_TABLET | Freq: Once | ORAL | Status: AC
  Administered 2025-01-09: 100 mg via ORAL
  Filled 2025-01-09: qty 1

## 2025-01-09 MED ORDER — DOXYCYCLINE HYCLATE 100 MG PO CAPS: 100.0000 mg | ORAL_CAPSULE | Freq: Two times a day (BID) | ORAL | 0 refills | Status: AC

## 2025-01-09 NOTE — ED Provider Notes (Signed)
 " [place] EMERGENCY DEPARTMENT AT [facility] Provider Note   CSN: [id number] Arrival date & time: 01/09/25  1631     Patient presents with: Acne   Angela Fisher is a 44 y.o. female.  Is ER for right chin swelling times week, states several days ago she used a needle to poke it but there was no drainage, continues to be painful and swollen, no fever or chills, denies any other lesions, no trouble swallowing or breathing.   HPI     Prior to Admission medications  Medication Sig Start Date End Date Taking? Authorizing Provider  Biotin w/ Vitamins C & E (HAIR/SKIN/NAILS PO) Take 1 tablet by mouth daily.    [provider]  Cholecalciferol (VITAMIN D3) 250 MCG (10000 UT) TABS Take 10,000 Units by mouth daily.    [provider]  diphenhydramine-acetaminophen (TYLENOL PM) 25-500 MG TABS tablet Take 2-3 tablets by mouth at bedtime.    [provider]  doxycycline  (VIBRAMYCIN ) 100 MG capsule Take 1 capsule (100 mg total) by mouth 2 (two) times daily. 09/02/24   Cleotilde Rogue, MD  Ginger, Zingiber officinalis, (GINGER ROOT) 550 MG CAPS Take 550 mg by mouth daily.    [provider]  Misc Natural Products (COLON CLEANSER PO) Take 1 capsule by mouth once a week.    [provider]  mupirocin  ointment (BACTROBAN ) 2 % Apply 1 Application topically 2 (two) times daily. 09/02/24   Cleotilde Rogue, MD    Allergies: Patient has no known allergies.    Review of Systems  Updated Vital Signs BP 124/80 (BP Location: Right Arm)   Pulse 84   Temp 98.7 F (37.1 C) (Oral)   Resp 18   Ht 5' 8 (1.727 m)   Wt 69.9 kg   SpO2 99%   BMI 23.42 kg/m   Physical Exam Vitals and nursing note reviewed.  Constitutional:      General: She is not in acute distress.    Appearance: She is well-developed.  HENT:     Head: Normocephalic and atraumatic.     Nose: Nose normal.     Mouth/Throat:     Mouth: Mucous membranes are moist.  Eyes:      Conjunctiva/sclera: Conjunctivae normal.     Pupils: Pupils are equal, round, and reactive to light.  Cardiovascular:     Rate and Rhythm: Normal rate and regular rhythm.     Heart sounds: No murmur heard. Pulmonary:     Effort: Pulmonary effort is normal. No respiratory distress.     Breath sounds: Normal breath sounds.  Abdominal:     Palpations: Abdomen is soft.     Tenderness: There is no abdominal tenderness.  Musculoskeletal:        General: No swelling.     Cervical back: Neck supple.  Skin:    General: Skin is warm and dry.     Capillary Refill: Capillary refill takes less than 2 seconds.     Comments: Erythema and induration to right chin approximately 3 cm in diameter, no fluctuance, no drainage.  Neurological:     General: No focal deficit present.     Mental Status: She is alert and oriented to person, place, and time.  Psychiatric:        Mood and Affect: Mood normal.     (all labs ordered are listed, but only abnormal results are displayed) Labs Reviewed - No data to display  EKG: None  Radiology: No results found.  Procedures   Medications Ordered in the ED - No data to display                                  Medical Decision Making Differential diagnosis includes not limited to abscess, cellulitis, insect bite, contact dermatitis, herpes zoster, other ED course: Patient presents to the ER for right hand redness and swelling, she thought it may need to drain so she had poked it with a needle several days ago but it has not improved and there is no drainage, there is induration and erythema with no fluctuance or evidence of abscess.  Discussed with patient this is likely cellulitis, will treat with doxycycline  and have her follow-up close with primary care doctor.  Advised on strict return precautions.  No swelling to the submental or submandibular area, no trouble swallowing or breathing, she has normal vitals, do not feel she needs any further workup  at this time.  Risk Prescription drug management.        Final diagnoses:  None    ED Discharge Orders     None          Suellen Sherran LABOR, PA-C 01/09/25 1950    Gennaro Duwaine CROME, DO 01/11/25 1449  "

## 2025-01-09 NOTE — Discharge Instructions (Addendum)
 Seen today for sore laying and redness to the chin, this is consistent with a skin infection.  There is no drainable abscess.  Take antibiotics, follow-up closely with your primary care doctor.  Do not stick needles or any other objects into your skin as this can make the infection worse.

## 2025-01-09 NOTE — ED Triage Notes (Signed)
 Pt has a pimple on her back and face for one week. Pt reports she attempted to pop it without success.
